# Patient Record
Sex: Female | Born: 1955 | Race: Black or African American | Hispanic: No | Marital: Single | Smoking: Never smoker
Health system: Southern US, Community
[De-identification: ages and names within clinical notes are randomized; demographics above are authoritative.]

## PROBLEM LIST (undated history)

## (undated) DIAGNOSIS — E7800 Pure hypercholesterolemia, unspecified: Secondary | ICD-10-CM

## (undated) DIAGNOSIS — L723 Sebaceous cyst: Secondary | ICD-10-CM

## (undated) DIAGNOSIS — E119 Type 2 diabetes mellitus without complications: Secondary | ICD-10-CM

## (undated) DIAGNOSIS — N898 Other specified noninflammatory disorders of vagina: Secondary | ICD-10-CM

## (undated) DIAGNOSIS — I10 Essential (primary) hypertension: Secondary | ICD-10-CM

## (undated) HISTORY — DX: Other specified noninflammatory disorders of vagina: N89.8

## (undated) HISTORY — DX: Sebaceous cyst: L72.3

## (undated) HISTORY — DX: Type 2 diabetes mellitus without complications: E11.9

## (undated) HISTORY — DX: Pure hypercholesterolemia, unspecified: E78.00

## (undated) HISTORY — PX: ABDOMINAL HYSTERECTOMY: SHX81

## (undated) HISTORY — PX: OTHER SURGICAL HISTORY: SHX169

---

## 2001-01-18 ENCOUNTER — Ambulatory Visit (HOSPITAL_COMMUNITY): Admission: RE | Admit: 2001-01-18 | Discharge: 2001-01-18 | Payer: Self-pay | Admitting: Internal Medicine

## 2001-01-18 ENCOUNTER — Encounter: Payer: Self-pay | Admitting: Internal Medicine

## 2001-01-26 ENCOUNTER — Inpatient Hospital Stay (HOSPITAL_COMMUNITY): Admission: AD | Admit: 2001-01-26 | Discharge: 2001-01-28 | Payer: Self-pay | Admitting: Internal Medicine

## 2003-02-09 ENCOUNTER — Inpatient Hospital Stay (HOSPITAL_COMMUNITY): Admission: RE | Admit: 2003-02-09 | Discharge: 2003-02-11 | Payer: Self-pay | Admitting: Obstetrics & Gynecology

## 2005-11-03 ENCOUNTER — Ambulatory Visit (HOSPITAL_COMMUNITY): Admission: RE | Admit: 2005-11-03 | Discharge: 2005-11-03 | Payer: Self-pay | Admitting: Internal Medicine

## 2006-06-23 ENCOUNTER — Ambulatory Visit (HOSPITAL_COMMUNITY): Admission: RE | Admit: 2006-06-23 | Discharge: 2006-06-23 | Payer: Self-pay | Admitting: Urology

## 2009-09-11 ENCOUNTER — Encounter: Payer: Self-pay | Admitting: Internal Medicine

## 2009-09-26 ENCOUNTER — Ambulatory Visit (HOSPITAL_COMMUNITY): Admission: RE | Admit: 2009-09-26 | Discharge: 2009-09-26 | Payer: Self-pay | Admitting: Internal Medicine

## 2009-09-26 ENCOUNTER — Ambulatory Visit: Payer: Self-pay | Admitting: Internal Medicine

## 2010-01-25 ENCOUNTER — Encounter: Payer: Self-pay | Admitting: Internal Medicine

## 2010-04-23 NOTE — Letter (Signed)
Summary: RELEASE OF RECORDS TO PT  RELEASE OF RECORDS TO PT   Imported By: Diana Eves 01/25/2010 13:07:01  _____________________________________________________________________  External Attachment:    Type:   Image     Comment:   External Document

## 2010-04-23 NOTE — Letter (Signed)
Summary: Internal Other Domingo Dimes  Internal Other Domingo Dimes   Imported By: Cloria Spring LPN 16/12/9602 54:09:81  _____________________________________________________________________  External Attachment:    Type:   Image     Comment:   External Document

## 2010-08-09 NOTE — Op Note (Signed)
[facility]  Patient:    Karina Kirk, Karina Kirk Visit Number: [id number] MRN: [id number]          Service Type: MED Location: 3A A311 01 Attending Physician:  Arna Snipe Dictated by:   Arna Snipe, M.D. Proc. Date: 01/26/01 Admit Date:  01/26/2001                             Operative Report  PREOPERATIVE DIAGNOSIS:  Fibroid uterus.  POSTOPERATIVE DIAGNOSIS:  Fibroid uterus.  OPERATION PERFORMED:  Total abdominal hysterectomy, left salpingo-oophorectomy and incidental appendectomy.  COMPLICATIONS:  None.  SURGEON:  Arna Snipe, M.D.  ANESTHESIA:  General anesthesia.  DESCRIPTION OF PROCEDURE:  Under adequate general anesthesia, patient was prepped and draped in the usual manner and a transverse Pfannenstiel incision was made.  This was carried through subcutaneous tissue to the surface of the anterior rectus sheath, which was incised in the midline, and the rectus muscle retracted.  The posterior rectus sheath and peritoneum were incised, the abdomen opened and explored.  The uterus was enlarged probably to about 4 months size.  There were multiple fibroids, especially a large one on the anterior aspect.  The left ovary was cystic; the right ovary appeared to be normal.  Small and large bowel were normal.  There was a normal-appearing retrocecal appendix.  The gallbladder felt normal.  The round ligaments were clamped, divided and suture-ligated with #1 chromic. The infundibulopelvic ligament on the left was clamped, divided and suture-ligated with #1 chromic.  The right mesosalpinx was clamped, divided and suture-ligated with #1 chromic.  The bladder was pushed inferiorly out of harms way and the uterine vessels were clamped, divided and suture-ligated with #1 chromic.  Following this, with upward traction on the uterus, utilizing electrocautery, the uterus, body and cervix, was removed in toto. The vaginal cuff was closed with a  continuous-locking #1 chromic, the entire operative area then reperitonealized with a continuous 0 chromic.  At completion of this portion of the procedure, all bleeding was under control. The cecum was identified and introduced into a wound.  There was noted to be a retrocecal appendix which was mobilized, mesoappendix doubly clamped, divided and ligated with 0 chromic catgut, the appendix ligated doubly at its base, amputated and buried in the mesoappendix.  After determining all bleeding was under control, the wound was closed in layers, the peritoneum and posterior rectus sheath with 00 Novofil, the rectus muscle and anterior rectus sheath with 00 Novofil, the subcutaneous tissue was closed with continuous 3-0 Vicryl and skin was closed with skin clips, Telfa and OpSite dressing applied.  The patient tolerated the procedure nicely and left the room in good condition. Dictated by:   Arna Snipe, M.D. Attending Physician:  Arna Snipe DD:  01/26/01 TD:  01/27/01 Job: [id number]

## 2010-08-09 NOTE — H&P (Signed)
Karina Kirk, Karina Kirk                           ACCOUNT NO.:  [id number]   MEDICAL RECORD NO.:  [id number]                   PATIENT TYPE:  AMB   LOCATION:  DAY                                  FACILITY:  [facility]   PHYSICIAN:  Lazaro Arms, M.D.                DATE OF BIRTH:  08/24/1955   DATE OF ADMISSION:  DATE OF DISCHARGE:                                HISTORY & PHYSICAL   HISTORY OF PRESENT ILLNESS:  The patient is a 55 year old African American  female, gravida 2, para 2, status post hysterectomy, who presented to our  office originally, January 11, 2003, complaining of stress urinary  incontinence with coughing and feeling like her bladder was falling out.  On  examination in the office initially, she had a grade II cystocele and  urethrocele and a positive Q-tip test.  She underwent urinary testing which  revealed genuine stress urinary incontinence.  There was no extrinsic  sphincter deficiency and no detrusor instability.  As a result, and because  of symptomatic cystocele and stress incontinence, she is admitted for an  anterior colporrhaphy, using a pelvichol graft, a retropubic midurethral  sling, using pelvilace and a vaginal vault suspension.   PAST MEDICAL HISTORY:  Significant for hypertension.   MEDICATIONS:  She did not bring it with her, but it is an antihypertensive.  Otherwise, she is not on any medications.   PAST SURGICAL HISTORY:  Significant only for the hysterectomy.   PAST OB HISTORY:  Two vaginal deliveries.   REVIEW OF SYSTEMS:  Otherwise, negative.   PHYSICAL EXAMINATION:  VITAL SIGNS:  Blood pressure in the office is 140/90.  GENERAL:  Weight 155 pounds.  HEENT:  Unremarkable.  NECK:  Thyroid is normal.  LUNGS:  Clear.  HEART:  Regular rate and rhythm.  No murmur, rubs or gallops.  BREASTS:  Without mass, discharge, skin changes.  ABDOMEN:  Benign, no hepatosplenomegaly or masses.  GENITOURINARY:  Normal external genitalia, except for the grade  II-III  cystocele and urethrocele.  There is no rectocele and the vaginal apex has  some relaxation with straining.  RECTAL:  Normal.  EXTREMITIES:  Warm, edema.  NEUROLOGIC:  Grossly intact.   IMPRESSIONS:  1. Symptomatic grade II-III cystocele.  2. Stress urinary incontinence by urodynamic testing.   PLAN:  The patient is admitted for an anterior colporrhaphy with pelvichol  graft, vaginal vault suspension as well and a retropubic midurethral sling  for stress incontinence.  She has been given handouts regarding the  pelvichol material and pelvilace.  She also understands the risks of  bleeding, infection, damage to other organs, most specifically, the bladder  and bowels and will proceed.     ___________________________________________  Lazaro Arms, M.D.   Loraine Maple  D:  02/08/2003  T:  02/09/2003  Job:  [id number]

## 2010-08-09 NOTE — Op Note (Signed)
NAMERAVENNE, WAYMENT                           ACCOUNT NO.:  [id number]   MEDICAL RECORD NO.:  [id number]                   PATIENT TYPE:  INP   LOCATION:  A417                                 FACILITY:  [facility]   PHYSICIAN:  Lazaro Arms, M.D.                DATE OF BIRTH:  1955/07/18   DATE OF PROCEDURE:  02/09/2003  DATE OF DISCHARGE:  02/11/2003                                 OPERATIVE REPORT   PREOPERATIVE DIAGNOSES:  1. Symptomatic cystocele, grade 3.  2. Urethrocele.  3. Stress urinary incontinence by history and definitely diagnosed with     interoffice urodynamics.   POSTOPERATIVE DIAGNOSES:  1. Symptomatic cystocele, grade 3.  2. Urethrocele.  3. Stress urinary incontinence by history and definitely diagnosed with     interoffice urodynamics.   OPERATION/PROCEDURE:  1. Anterior colporrhaphy with use of Pelvicol graft.  2. Retropubic mid urethral sling.  3. Interspinous vaginal vault suspension.   SURGEON:  Lazaro Arms, M.D.   ANESTHESIA:  General endotracheal anesthesia.   FINDINGS:  The patient had a grade 3 cystocele and vaginal apex prolapse.  She also had symptomatic genuine stress urinary incontinence.   DESCRIPTION OF PROCEDURE:  The patient was taken to the operating room and  placed in the supine position, underwent general anesthesia and she was then  prepped and draped in the usual sterile fashion after having been placed in  the dorsal lithotomy position.  A Foley catheter was placed.  The vaginal  apex was grasped and incised.  The anterior vagina was then dissected off of  the bladder after 0.5% Marcaine was injected for hemostasis and plane  development, dissected laterally all the way to a sacrospinous loop to the  ischial spines and the sacrospinous ligaments were identified.  An 8 x 12  Pelvicol graft was then used and attached to the sacrospinous ligaments  bilaterally using a ________ needle retriever system and 0 Vicryl hub  suture.  This  was done without difficulty.  I then made two small incisions  just above the pubis, 2 cm from the midline bilaterally and the mid urethral  Pelvicol sling was then placed without difficulty in the usual fashion.  A  cystoscopy was performed when both needles were in place and there was no  bladder perforation that was found. It was then pulled back through and  placed loosely under the mid urethra.  I then attached the anterior or  cephalad portion of the Pelvicol graft to the pubovesical fascia bilaterally  and then attached the anterior vagina to the wing of the Pelvicol graft as  an interspinous vaginal vault  suspension.  The excess vagina was then trimmed and then closed in the usual  fashion without difficulty.  There was good hemostasis.  Vaginal packing was  used.  The patient was awakened from anesthesia and taken to the recovery  room in good  and stable condition.  All counts were correct.      ___________________________________________                                            Lazaro Arms, M.D.   LHE/MEDQ  D:  02/23/2003  T:  02/23/2003  Job:  [id number]

## 2010-08-09 NOTE — Discharge Summary (Signed)
[facility]  Patient:    Karina Kirk, Karina Kirk Visit Number: [id number] MRN: [id number]          Service Type: MED Location: 3A A311 01 Attending Physician:  Arna Snipe Dictated by:   Arna Snipe, M.D. Admit Date:  01/26/2001 Discharge Date: 01/28/2001                             Discharge Summary  ADMISSION DIAGNOSIS:  Fibroid uterus with menometrorrhagia.  DISCHARGE DIAGNOSIS:  Fibroid uterus with menometrorrhagia.  OPERATIONS:  January 26, 2001, total abdominal hysterectomy, left salpingo-oophorectomy, and incidental appendectomy.  COMPLICATIONS:  None.  PROGNOSIS:  Good.  The patient recovered.  HISTORY OF PRESENT ILLNESS:  This 55 year old black female was admitted to the hospital on January 26, 2001, with a long history of irregular, prolonged vaginal bleeding associated with her periods.  Exam did reveal a large fibroid uterus.  She was admitted to the hospital for that reason.  HOSPITAL COURSE:  On January 26, 2001, the patient was taken to the operating room.  A total abdominal hysterectomy with salpingo-oophorectomy for a large fibroid uterus and incidental removal of a retrocecal appendix was carried out without difficulty.  Postoperatively she did well.  Foley catheter was removed on January 27, 2001, and she was voiding without difficulty.  Her urine did reveal multiple wbcs, and the patient was started on Septra.  From this point on, her progress was good.  She was discharged home on January 28, 2001, on Septra.  The wound was redressed and was clean.  She will be seen in my office in one week. Dictated by:   Arna Snipe, M.D. Attending Physician:  Arna Snipe DD:  04/09/01 TD:  04/11/01 Job: [id number]

## 2010-08-09 NOTE — H&P (Signed)
[facility]  Patient:    Karina Kirk, Karina Kirk Visit Number: [id number] MRN: [id number]          Service Type: MED Location: 3A A311 01 Attending Physician:  Arna Snipe Dictated by:   Arna Snipe, M.D. Admit Date:  01/26/2001                           History and Physical  This 55 year old black female is admitted to this hospital for a total abdominal hysterectomy.  HISTORY OF PRESENT ILLNESS:  This patient gives a long history of irregular periods and chronic vaginal infection.  I have been treating her on and off since 1996 for these problems, all to no avail.  Recent ultrasound did reveal multiple large fibroids.  She does give a history in the last 6-8 months that periods are lasting for about 2 weeks.  She is admitted now for a hysterectomy as mentioned.  PAST HISTORY:  She is a gravida 2, para 2, AB 0.  She has had a prior bilateral tubal ligation.  MEDICATIONS:  She presently takes Zestoretic 20/25 daily.  PHYSICAL EXAMINATION:  GENERAL:  Reveals a healthy, 55 year old black female in no acute distress.  VITAL SIGNS:  Her blood pressure was 144/86, pulse 80, respirations 20.  HEAD, EYES, EARS, NOSE AND THROAT:  Normal.  No jaundice.  NECK:  Supple.  Thyroid not enlarged.  No palpable cervical adenopathy.  CARDIOVASCULAR:  Regular sinus rhythm, no thrills or murmurs.  RESPIRATORY:  Chest clear to percussion and auscultation.  BREASTS:  Moderate size.  Nipples symmetrical.  No abnormal masses. Examination of both axillae are normal.  ABDOMEN:  Soft, small incision below the umbilicus secondary to previous tubal ligation.  No areas of muscle guarding, or visceromegaly.  Normal peristalsis.  LIMBS AND BACK:  Negative.  PELVIC:  There is a marital introitus.  No pathology Bartholin and skene glands.  There is some old blood in the vault.  A recent Pap smear was normal. Cervix is clean.  Uterus is anterior.  It appears to be  enlarged to about 3-4 months size.  Examination of both adnexa are normal.  RECTAL/VAGINAL:  Examination is normal.  ADMISSION DIAGNOSIS:  Fibroid uterus with menometrorrhagia.  DISPOSITION:  The patient is admitted for a total abdominal hysterectomy, possibly an appendectomy.  The surgery, risks, complications, and consequences have been discussed thoroughly with this patient.  She agrees to the surgery. She has been scheduled for November 5. Dictated by:   Arna Snipe, M.D. Attending Physician:  Arna Snipe DD:  01/25/01 TD:  01/25/01 Job: [id number]

## 2010-10-26 ENCOUNTER — Encounter: Payer: Self-pay | Admitting: *Deleted

## 2010-10-26 ENCOUNTER — Emergency Department (HOSPITAL_COMMUNITY): Payer: Worker's Compensation

## 2010-10-26 ENCOUNTER — Emergency Department (HOSPITAL_COMMUNITY)
Admission: EM | Admit: 2010-10-26 | Discharge: 2010-10-26 | Disposition: A | Discharge status: Home / Self Care | Payer: Worker's Compensation | Attending: Emergency Medicine | Admitting: Emergency Medicine

## 2010-10-26 DIAGNOSIS — I10 Essential (primary) hypertension: Secondary | ICD-10-CM | POA: Insufficient documentation

## 2010-10-26 DIAGNOSIS — S0990XA Unspecified injury of head, initial encounter: Secondary | ICD-10-CM

## 2010-10-26 DIAGNOSIS — W010XXA Fall on same level from slipping, tripping and stumbling without subsequent striking against object, initial encounter: Secondary | ICD-10-CM | POA: Insufficient documentation

## 2010-10-26 DIAGNOSIS — Y9269 Other specified industrial and construction area as the place of occurrence of the external cause: Secondary | ICD-10-CM | POA: Insufficient documentation

## 2010-10-26 HISTORY — DX: Essential (primary) hypertension: I10

## 2010-10-26 MED ORDER — HYDROCODONE-ACETAMINOPHEN 5-325 MG PO TABS: ORAL_TABLET | ORAL | Status: DC

## 2010-10-26 NOTE — ED Provider Notes (Signed)
History     CSN: [id number] Arrival date & time: 10/26/2010  4:14 PM  Chief Complaint  Patient presents with  . Head Injury   HPI Comments: Patient states she was at work and tripped over something and fell, struck her left head on the concrete floor.  She c/o headache since the fall but denies LOC, neck or back pain, visual changes, vomiting , weakness or dizziness.  Patient is a 55 y.o. female presenting with head injury. The history is provided by the patient.  Head Injury  The incident occurred 1 to 2 hours ago. She came to the ER via walk-in. The injury mechanism was a fall. There was no loss of consciousness. There was no blood loss. The quality of the pain is described as dull and throbbing. The pain is mild. The pain has been constant since the injury. Pertinent negatives include no numbness, no blurred vision, no vomiting, no disorientation, no weakness and no memory loss. Associated symptoms comments: headache. She has tried nothing for the symptoms. The treatment provided no relief.    Past Medical History  Diagnosis Date  . Hypertension     Past Surgical History  Procedure Date  . Abdominal hysterectomy     History reviewed. No pertinent family history.  History  Substance Use Topics  . Smoking status: Never Smoker   . Smokeless tobacco: Not on file  . Alcohol Use: No    OB History    Grav Para Term Preterm Abortions TAB SAB Ect Mult Living                  Review of Systems  Constitutional: Negative for fever, chills, activity change and appetite change.  HENT: Negative for nosebleeds, facial swelling, trouble swallowing, neck pain and neck stiffness.   Eyes: Negative for blurred vision, photophobia, pain, redness and visual disturbance.  Respiratory: Negative.   Cardiovascular: Negative.   Gastrointestinal: Negative for nausea and vomiting.  Musculoskeletal: Negative.   Skin: Negative.   Neurological: Positive for headaches. Negative for dizziness, speech  difficulty, weakness, light-headedness and numbness.  Hematological: Does not bruise/bleed easily.  Psychiatric/Behavioral: Negative.  Negative for memory loss.    Physical Exam  BP 144/82  Pulse 92  Temp(Src) 98.7 F (37.1 C) (Oral)  Resp 16  Ht 5\' 6"  (1.676 m)  Wt 160 lb (72.576 kg)  BMI 25.82 kg/m2  SpO2 100%  Physical Exam  Nursing note and vitals reviewed. Constitutional: She is oriented to person, place, and time. She appears well-developed and well-nourished. No distress.  HENT:  Head: Normocephalic and atraumatic.  Right Ear: External ear normal.  Mouth/Throat: Oropharynx is clear and moist.  Eyes: Conjunctivae and EOM are normal. Pupils are equal, round, and reactive to light.  Neck: Normal range of motion. Neck supple.  Cardiovascular: Normal rate, regular rhythm and normal heart sounds.   Pulmonary/Chest: Effort normal and breath sounds normal.  Abdominal: Soft. Bowel sounds are normal. There is no tenderness.  Musculoskeletal: She exhibits tenderness. She exhibits no edema.  Lymphadenopathy:    She has no cervical adenopathy.  Neurological: She is alert and oriented to person, place, and time. She has normal reflexes. She displays no tremor and normal reflexes. No cranial nerve deficit or sensory deficit. She exhibits normal muscle tone. She displays a negative Romberg sign. Coordination and gait normal. GCS eye subscore is 4. GCS verbal subscore is 5. GCS motor subscore is 6.  Skin: Skin is warm and dry.  Psychiatric: She has a  normal mood and affect.    ED Course  Procedures  MDM   1750  Patient is resting, talking with coworker.  NAD.  Vitals stable.  Ambulates w/o difficulty.  No focal neuro deficits.  Negative Rhomberg.  Likely minor head injury.  I have reviewed CT results and discussed CT the results with the pt and she agrees to close follow-up with her PMD and I have also advised her to return here for any worsening symptoms      Tammy L. [place],  [place] 10/28/10 1545  Medical screening examination/treatment/procedure(s) were performed by non-physician practitioner and as supervising physician I was immediately available for consultation/collaboration.   Sunnie Nielsen, MD 10/31/10 [phone]

## 2010-10-26 NOTE — ED Notes (Signed)
Patient with no complaints at this time. Respirations even and unlabored. Skin warm/dry. Discharge instructions reviewed with patient at this time. Patient given opportunity to voice concerns/ask questions. Patient discharged at this time and left Emergency Department with steady gait.   

## 2010-10-26 NOTE — ED Notes (Signed)
Pt tripped and fell at work. Hit the front left side of her head on concrete. Denies loss of consciousness.

## 2012-09-22 ENCOUNTER — Other Ambulatory Visit: Payer: Self-pay | Admitting: Adult Health

## 2012-11-23 ENCOUNTER — Ambulatory Visit (INDEPENDENT_AMBULATORY_CARE_PROVIDER_SITE_OTHER): Payer: 59 | Admitting: Adult Health

## 2012-11-23 ENCOUNTER — Encounter: Payer: Self-pay | Admitting: Adult Health

## 2012-11-23 VITALS — BP 100/60 | HR 72 | Ht 64.0 in | Wt 165.0 lb

## 2012-11-23 DIAGNOSIS — E119 Type 2 diabetes mellitus without complications: Secondary | ICD-10-CM | POA: Insufficient documentation

## 2012-11-23 DIAGNOSIS — E7800 Pure hypercholesterolemia, unspecified: Secondary | ICD-10-CM

## 2012-11-23 DIAGNOSIS — Z01419 Encounter for gynecological examination (general) (routine) without abnormal findings: Secondary | ICD-10-CM

## 2012-11-23 DIAGNOSIS — I10 Essential (primary) hypertension: Secondary | ICD-10-CM | POA: Insufficient documentation

## 2012-11-23 DIAGNOSIS — Z1212 Encounter for screening for malignant neoplasm of rectum: Secondary | ICD-10-CM

## 2012-11-23 LAB — HEMOCCULT GUIAC POC 1CARD (OFFICE): Fecal Occult Blood, POC: NEGATIVE

## 2012-11-23 NOTE — Progress Notes (Signed)
Patient ID: Karina Kirk, female   DOB: Apr 03, 1955, 57 y.o.   MRN: [id number] History of Present Illness: Karina Kirk is a 57 year old black female in for physical.   Current Medications, Allergies, Past Medical History, Past Surgical History, Family History and Social History were reviewed in [employer] medical record.     Review of Systems: Patient denies any headaches, blurred vision, shortness of breath, chest pain, abdominal pain, problems with bowel movements, urination, or intercourse.Not having sex. Neck feels still,? Slept wrong, no mood changes.    Physical Exam:BP 100/60  Pulse 72  Ht 5\' 4"  (1.626 m)  Wt 165 lb (74.844 kg)  BMI 28.31 kg/m2 General:  Well developed, well nourished, no acute distress Skin:  Warm and dry Neck:  Midline trachea, normal thyroid Lungs; Clear to auscultation bilaterally Breast:  No dominant palpable mass, retraction, or nipple discharge Cardiovascular: Regular rate and rhythm Abdomen:  Soft, non tender, no hepatosplenomegaly Pelvic:  External genitalia is normal in appearance.  The vagina is normal in appearance. The cervix and uterus are absent.Cuff looks good.  No adnexal masses or tenderness noted. Rectal: Good sphincter tone, no polyps, or hemorrhoids felt.  Hemoccult negative. Extremities:  No swelling or varicosities noted Psych:  Alert andcooperative, seems happy   Impression: Yearly gyn exam History of hypertension,diabetes,a dn elevated cholesterol   Plan: Physical in 1 year Mammogram yearly  Colonoscopy at 56  Labs with Dr Margo Aye Call prn

## 2012-11-23 NOTE — Patient Instructions (Addendum)
Physical in 1 year Mammogram yearly Colonoscopy at 61  Labs at PCP

## 2014-01-23 ENCOUNTER — Encounter: Payer: Self-pay | Admitting: Adult Health

## 2015-03-16 ENCOUNTER — Encounter: Payer: Self-pay | Admitting: Obstetrics & Gynecology

## 2015-03-16 ENCOUNTER — Ambulatory Visit (INDEPENDENT_AMBULATORY_CARE_PROVIDER_SITE_OTHER): Payer: BLUE CROSS/BLUE SHIELD | Admitting: Obstetrics & Gynecology

## 2015-03-16 VITALS — BP 100/62 | Ht 63.0 in | Wt 147.0 lb

## 2015-03-16 DIAGNOSIS — B9689 Other specified bacterial agents as the cause of diseases classified elsewhere: Secondary | ICD-10-CM

## 2015-03-16 DIAGNOSIS — N760 Acute vaginitis: Secondary | ICD-10-CM | POA: Diagnosis not present

## 2015-03-16 DIAGNOSIS — A499 Bacterial infection, unspecified: Secondary | ICD-10-CM | POA: Diagnosis not present

## 2015-03-16 DIAGNOSIS — L293 Anogenital pruritus, unspecified: Secondary | ICD-10-CM | POA: Diagnosis not present

## 2015-03-16 MED ORDER — METRONIDAZOLE 500 MG PO TABS: 500.0000 mg | ORAL_TABLET | Freq: Two times a day (BID) | ORAL | Status: DC

## 2015-03-16 NOTE — Progress Notes (Signed)
Patient ID: Karina Kirk, female   DOB: November 12, 1955, 59 y.o.   MRN: [id number] History of Present Illness   Patient Identification Karina Kirk is a 59 y.o. female.  Patient information was obtained from patient. History/Exam limitations: none. Patient presented to the [facility] by private vehicle.  Chief Complaint  Vaginal Itching   The patient complains vaginal discharge scant. Onset of symptoms was gradual starting 2 weeks ago. Severity of symptoms mild. Symptoms occur spontaneous Symptoms have been constant. Symptoms are aggravated by nothing, alleviated by nothing and are associated with . Other modifying factors include diabetic no sex for 5-6 years. Previous evaluation includes none. Care prior to arrival consisted of nothing, with no relief.  Past Medical History  Diagnosis Date  . Hypertension   . Diabetes mellitus without complication ([place])   . Elevated cholesterol    Family History  Problem Relation Age of Onset  . Diabetes Mother   . Hypertension Mother   . Diabetes Sister   . Hypertension Sister    Scheduled Meds: Continuous Infusions: PRN Meds:  Allergies  Allergen Reactions  . Sulfa Antibiotics Rash   Social History   Social History  . Marital Status: Single    Spouse Name: N/A  . Number of Children: N/A  . Years of Education: N/A   Occupational History  . Not on file.   Social History Main Topics  . Smoking status: Never Smoker   . Smokeless tobacco: Never Used  . Alcohol Use: No     Comment: occ. beer  . Drug Use: No  . Sexual Activity: Not Currently    Birth Control/ Protection: Surgical   Other Topics Concern  . Not on file   Social History Narrative   Review of Systems Pertinent items are noted in HPI.   Physical Exam   BP 100/62 mmHg  Ht 5\' 3"  (1.6 m)  Wt 147 lb (66.679 kg)  BMI 26.05 kg/m2 General:   alert, cooperative and no distress  Heart:   Lungs:   Abdomen: soft, non-tender, without masses or organomegaly  Pelvic:     Vulva: normal   Vagina:  thin grey discharge   Cervix: absent   Uterus: absent   Adnexa:    ED Course   Studies: Lab: wet prep  Wet Prep:   A sample of vaginal discharge was obtained from the posterior fornix using a cotton swab. 2 drops of saline were placed on a slide and the cotton swab was immersed in the saline. Microscopic evaluation was performed and results were as follows:  Negative  for yeast  Positive for clue cells , consistent with Bacterial vaginosis Negative for trichomonas  Normal WBC population   Whiff test: Positive   Records Reviewed: Old medical records.  Treatments:   Consultations:   Disposition:   BV (bacterial vaginosis)  Meds ordered this encounter  Medications  . metroNIDAZOLE (FLAGYL) 500 MG tablet    Sig: Take 1 tablet (500 mg total) by mouth 2 (two) times daily.    Dispense:  14 tablet    Refill:  0    Follow up 3 weeks .

## 2015-04-05 ENCOUNTER — Ambulatory Visit: Payer: BLUE CROSS/BLUE SHIELD | Admitting: Obstetrics & Gynecology

## 2015-05-03 ENCOUNTER — Ambulatory Visit: Payer: BLUE CROSS/BLUE SHIELD | Admitting: Obstetrics & Gynecology

## 2015-06-22 ENCOUNTER — Encounter: Payer: Self-pay | Admitting: Adult Health

## 2015-06-22 ENCOUNTER — Ambulatory Visit (INDEPENDENT_AMBULATORY_CARE_PROVIDER_SITE_OTHER): Payer: BLUE CROSS/BLUE SHIELD | Admitting: Adult Health

## 2015-06-22 VITALS — BP 132/80 | HR 76 | Ht 65.0 in | Wt 144.5 lb

## 2015-06-22 DIAGNOSIS — N898 Other specified noninflammatory disorders of vagina: Secondary | ICD-10-CM

## 2015-06-22 DIAGNOSIS — Z90710 Acquired absence of both cervix and uterus: Secondary | ICD-10-CM | POA: Diagnosis not present

## 2015-06-22 HISTORY — DX: Other specified noninflammatory disorders of vagina: N89.8

## 2015-06-22 LAB — POCT WET PREP (WET MOUNT)
Clue Cells Wet Prep Whiff POC: NEGATIVE
WBC, Wet Prep HPF POC: POSITIVE

## 2015-06-22 NOTE — Progress Notes (Signed)
Subjective:     Patient ID: Karina Kirk, female   DOB: [birth year], 60 y.o.   MRN: [id number]  HPI Karina Kirk is a 60 year old black female in complaining of discharge in her panties, no itching or burning or odor. PCP is Dr Karina Kirk.   Review of Systems Patient denies any headaches, hearing loss, fatigue, blurred vision, shortness of breath, chest pain, abdominal pain, problems with bowel movements, urination, or intercourse(not having sex). No joint pain or mood swings, + vaginal discharge Reviewed past medical,surgical, social and family history. Reviewed medications and allergies.     Objective:   Physical Exam BP 132/80 mmHg  Pulse 76  Ht 5\' 5"  (1.651 m)  Wt 144 lb 8 oz (65.545 kg)  BMI 24.05 kg/m2 Skin warm and dry.Pelvic: external genitalia is normal in appearance no lesions, vagina: white discharge without odor,urethra has no lesions or masses noted, cervix and uterus are absent, adnexa: no masses or tenderness noted. Bladder is non tender and no masses felt. Wet prep:  +WBCs.   Discussed that some discharge is normal, as long as no odor or burning or itching.  Assessment:     Vaginal discharge    Plan:     Follow up prn

## 2015-06-22 NOTE — Patient Instructions (Signed)
Follow up prn

## 2015-06-22 NOTE — Addendum Note (Signed)
Addended by: Derrek Monaco A on: 06/22/2015 10:46 AM   Modules accepted: Orders

## 2016-01-03 ENCOUNTER — Ambulatory Visit (INDEPENDENT_AMBULATORY_CARE_PROVIDER_SITE_OTHER): Payer: BLUE CROSS/BLUE SHIELD | Admitting: Orthopaedic Surgery

## 2016-01-03 DIAGNOSIS — S61210S Laceration without foreign body of right index finger without damage to nail, sequela: Secondary | ICD-10-CM | POA: Diagnosis not present

## 2016-01-17 ENCOUNTER — Ambulatory Visit (INDEPENDENT_AMBULATORY_CARE_PROVIDER_SITE_OTHER): Payer: BLUE CROSS/BLUE SHIELD | Admitting: Women's Health

## 2016-01-17 ENCOUNTER — Encounter: Payer: Self-pay | Admitting: Women's Health

## 2016-01-17 DIAGNOSIS — L292 Pruritus vulvae: Secondary | ICD-10-CM

## 2016-01-17 DIAGNOSIS — N9089 Other specified noninflammatory disorders of vulva and perineum: Secondary | ICD-10-CM | POA: Diagnosis not present

## 2016-01-17 MED ORDER — NYSTATIN-TRIAMCINOLONE 100000-0.1 UNIT/GM-% EX OINT: 1.0000 "application " | TOPICAL_OINTMENT | Freq: Two times a day (BID) | CUTANEOUS | 0 refills | Status: DC
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
## 2016-01-18 NOTE — Addendum Note (Signed)
Addended by: Diona Fanti A on: 01/18/2016 01:58 PM   Modules accepted: Orders

## 2016-01-21 LAB — HERPES SIMPLEX VIRUS CULTURE

## 2016-01-24 ENCOUNTER — Encounter: Payer: Self-pay | Admitting: Women's Health

## 2016-01-24 ENCOUNTER — Ambulatory Visit (INDEPENDENT_AMBULATORY_CARE_PROVIDER_SITE_OTHER): Payer: BLUE CROSS/BLUE SHIELD | Admitting: Women's Health

## 2016-01-24 VITALS — BP 122/70 | HR 78

## 2016-01-24 DIAGNOSIS — N9089 Other specified noninflammatory disorders of vulva and perineum: Secondary | ICD-10-CM | POA: Diagnosis not present

## 2016-01-24 DIAGNOSIS — L700 Acne vulgaris: Secondary | ICD-10-CM | POA: Diagnosis not present

## 2016-01-24 DIAGNOSIS — Z90710 Acquired absence of both cervix and uterus: Secondary | ICD-10-CM

## 2016-01-24 NOTE — Progress Notes (Signed)
   [facility] Visit  Patient name: Karina Kirk MRN [id number]  Date of birth: [birth year]  CC & HPI:  Karina Kirk is a 60 y.o. G36P2 African American female presenting today for f/u vulvar lesions. HSV culture was neg although lesions were ~1wk old and dried. Pt reports itching has resolved, feels much better. Has 'blackhead' on Lt upper back x 10mth, daughter tried to squeeze it when it was smaller but couldn't get anything out. Not hurting, no drainage.  No LMP recorded. Patient has had a hysterectomy.  Pertinent History Reviewed:  Medical & Surgical Hx:   Past medical, surgical, family, and social history reviewed in electronic medical record Medications: Reviewed & Updated - see associated section Allergies: Reviewed in electronic medical record  Objective Findings:  Vitals: BP 122/70 (BP Location: Right Arm, Patient Position: Sitting, Cuff Size: Normal)   Pulse 78  There is no height or weight on file to calculate BMI.  Physical Examination: General appearance - alert, well appearing, and in no distress Pelvic: vulvar lesions completely resolved Back: ~1.5cm raised comedone Lt upper shoulder, co-exam w/ JVF- will need to come back to have excised  No results found for this or any previous visit (from the past 24 hour(s)).   Assessment & Plan:  A:   Resolved vulvar lesions  P:  Return if vulvar lesions return  Return for 4:00pm appt w/ JVF for removal of comedone from back. (pt prefers 4pm appt d/t work)  Tawnya Crook CNM, [facility] 01/24/2016 4:52 PM

## 2016-02-04 ENCOUNTER — Telehealth (INDEPENDENT_AMBULATORY_CARE_PROVIDER_SITE_OTHER): Payer: Self-pay | Admitting: *Deleted

## 2016-02-04 NOTE — Telephone Encounter (Signed)
Pt called stating Dr. Lorin Mercy was going to refe pt to physical therapy but pt has not heard from anyone. pt call back number is [phone]
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
## 2016-02-04 NOTE — Telephone Encounter (Signed)
She has already had therapy. I called, she has been released. Has had extension therapy.FYI

## 2016-02-20 ENCOUNTER — Ambulatory Visit: Payer: BLUE CROSS/BLUE SHIELD | Admitting: Obstetrics and Gynecology

## 2016-02-29 ENCOUNTER — Emergency Department (HOSPITAL_COMMUNITY)
Admission: EM | Admit: 2016-02-29 | Discharge: 2016-02-29 | Disposition: A | Discharge status: Home / Self Care | Payer: BLUE CROSS/BLUE SHIELD | Attending: Emergency Medicine | Admitting: Emergency Medicine

## 2016-02-29 ENCOUNTER — Emergency Department (HOSPITAL_COMMUNITY): Payer: BLUE CROSS/BLUE SHIELD

## 2016-02-29 ENCOUNTER — Encounter (HOSPITAL_COMMUNITY): Payer: Self-pay | Admitting: Emergency Medicine

## 2016-02-29 DIAGNOSIS — I10 Essential (primary) hypertension: Secondary | ICD-10-CM | POA: Insufficient documentation

## 2016-02-29 DIAGNOSIS — Y939 Activity, unspecified: Secondary | ICD-10-CM | POA: Insufficient documentation

## 2016-02-29 DIAGNOSIS — E119 Type 2 diabetes mellitus without complications: Secondary | ICD-10-CM | POA: Diagnosis not present

## 2016-02-29 DIAGNOSIS — M7918 Myalgia, other site: Secondary | ICD-10-CM

## 2016-02-29 DIAGNOSIS — Z79899 Other long term (current) drug therapy: Secondary | ICD-10-CM | POA: Diagnosis not present

## 2016-02-29 DIAGNOSIS — Y92410 Unspecified street and highway as the place of occurrence of the external cause: Secondary | ICD-10-CM | POA: Insufficient documentation

## 2016-02-29 DIAGNOSIS — M7551 Bursitis of right shoulder: Secondary | ICD-10-CM | POA: Diagnosis not present

## 2016-02-29 DIAGNOSIS — Z7984 Long term (current) use of oral hypoglycemic drugs: Secondary | ICD-10-CM | POA: Insufficient documentation

## 2016-02-29 DIAGNOSIS — S4991XA Unspecified injury of right shoulder and upper arm, initial encounter: Secondary | ICD-10-CM | POA: Diagnosis present

## 2016-02-29 DIAGNOSIS — Y999 Unspecified external cause status: Secondary | ICD-10-CM | POA: Insufficient documentation

## 2016-02-29 MED ORDER — OXYCODONE-ACETAMINOPHEN 5-325 MG PO TABS
1.0000 | ORAL_TABLET | Freq: Once | ORAL | Status: AC
  Administered 2016-02-29: 1 via ORAL
  Filled 2016-02-29: qty 1

## 2016-02-29 MED ORDER — IBUPROFEN 400 MG PO TABS
600.0000 mg | ORAL_TABLET | Freq: Once | ORAL | Status: AC
  Administered 2016-02-29: 600 mg via ORAL
  Filled 2016-02-29: qty 2

## 2016-02-29 MED ORDER — HYDROCODONE-ACETAMINOPHEN 5-325 MG PO TABS: ORAL_TABLET | ORAL | 0 refills | Status: DC

## 2016-02-29 MED ORDER — METHOCARBAMOL 500 MG PO TABS: 1000.0000 mg | ORAL_TABLET | Freq: Four times a day (QID) | ORAL | 0 refills | Status: DC | PRN

## 2016-02-29 NOTE — ED Notes (Signed)
Pt wanting to know how much longer she was going to be here. Informed pt CT results just came back & that I would let EDP know that everything has resulted.

## 2016-02-29 NOTE — ED Notes (Signed)
Pt alert & oriented x4, stable gait. Patient given discharge instructions, paperwork & prescription(s). Patient informed not to drive, operate any equipment & handel any important documents 4 hours after taking pain medication. Patient  instructed to stop at the registration desk to finish any additional paperwork. Patient  verbalized understanding. Pt left department w/ no further questions. 
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
## 2016-02-29 NOTE — Discharge Instructions (Signed)
Take the prescriptions as directed.  Apply moist heat or ice to the area(s) of discomfort, for 15 minutes at a time, several times per day for the next few days.  Do not fall asleep on a heating or ice pack. Wear the shoulder sling for comfort for the next few days, then remove and slowly return to your usual activities. Call your regular medical doctor and the Orthopedist on Monday to schedule a follow up appointment this week.  Return to the Emergency Department immediately if worsening.
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
## 2016-03-03 ENCOUNTER — Telehealth: Payer: Self-pay | Admitting: Orthopedic Surgery
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
## 2016-03-05 ENCOUNTER — Ambulatory Visit (INDEPENDENT_AMBULATORY_CARE_PROVIDER_SITE_OTHER): Payer: BLUE CROSS/BLUE SHIELD | Admitting: Orthopaedic Surgery

## 2016-03-05 ENCOUNTER — Encounter (INDEPENDENT_AMBULATORY_CARE_PROVIDER_SITE_OTHER): Payer: Self-pay | Admitting: Orthopaedic Surgery

## 2016-03-05 VITALS — BP 125/84 | HR 78 | Ht 66.0 in | Wt 158.0 lb

## 2016-03-05 DIAGNOSIS — M25511 Pain in right shoulder: Secondary | ICD-10-CM
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
## 2016-03-11 ENCOUNTER — Ambulatory Visit: Payer: Self-pay | Admitting: Orthopedic Surgery

## 2016-03-13 ENCOUNTER — Telehealth (INDEPENDENT_AMBULATORY_CARE_PROVIDER_SITE_OTHER): Payer: Self-pay | Admitting: Orthopaedic Surgery

## 2016-03-13 NOTE — Telephone Encounter (Signed)
Karina Kirk, this is a [facility] patient.

## 2016-03-14 ENCOUNTER — Ambulatory Visit (INDEPENDENT_AMBULATORY_CARE_PROVIDER_SITE_OTHER): Payer: BLUE CROSS/BLUE SHIELD | Admitting: Orthopaedic Surgery

## 2016-03-14 NOTE — Telephone Encounter (Signed)
I called patient and left voicemail that paperwork is ready at front desk.

## 2016-03-14 NOTE — Telephone Encounter (Signed)
Karina Kirk, sorry I sent this to Marlou Sa instead of you, this is a [facility] pt.

## 2016-03-14 NOTE — Telephone Encounter (Signed)
None or patient

## 2016-03-20 ENCOUNTER — Ambulatory Visit (INDEPENDENT_AMBULATORY_CARE_PROVIDER_SITE_OTHER): Payer: BLUE CROSS/BLUE SHIELD | Admitting: Orthopaedic Surgery

## 2016-03-20 ENCOUNTER — Encounter (INDEPENDENT_AMBULATORY_CARE_PROVIDER_SITE_OTHER): Payer: Self-pay | Admitting: Orthopaedic Surgery

## 2016-03-20 VITALS — BP 134/86 | HR 81 | Ht 66.0 in | Wt 158.0 lb

## 2016-03-20 DIAGNOSIS — M545 Low back pain, unspecified: Secondary | ICD-10-CM

## 2016-03-20 DIAGNOSIS — M542 Cervicalgia: Secondary | ICD-10-CM | POA: Diagnosis not present

## 2016-03-20 DIAGNOSIS — M25511 Pain in right shoulder: Secondary | ICD-10-CM
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
## 2016-03-25 ENCOUNTER — Encounter (HOSPITAL_COMMUNITY): Payer: Self-pay | Admitting: Physical Therapy

## 2016-03-25 ENCOUNTER — Ambulatory Visit (HOSPITAL_COMMUNITY): Payer: BLUE CROSS/BLUE SHIELD | Attending: Orthopaedic Surgery | Admitting: Physical Therapy

## 2016-03-25 ENCOUNTER — Ambulatory Visit (INDEPENDENT_AMBULATORY_CARE_PROVIDER_SITE_OTHER): Payer: BLUE CROSS/BLUE SHIELD | Admitting: Orthopaedic Surgery

## 2016-03-25 DIAGNOSIS — M25511 Pain in right shoulder: Secondary | ICD-10-CM | POA: Insufficient documentation

## 2016-03-25 DIAGNOSIS — M6281 Muscle weakness (generalized): Secondary | ICD-10-CM | POA: Insufficient documentation

## 2016-03-25 DIAGNOSIS — M25551 Pain in right hip: Secondary | ICD-10-CM | POA: Diagnosis not present

## 2016-03-25 DIAGNOSIS — M542 Cervicalgia: Secondary | ICD-10-CM | POA: Insufficient documentation
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
## 2016-04-01 ENCOUNTER — Ambulatory Visit (HOSPITAL_COMMUNITY): Payer: BLUE CROSS/BLUE SHIELD

## 2016-04-01 DIAGNOSIS — M6281 Muscle weakness (generalized): Secondary | ICD-10-CM

## 2016-04-01 DIAGNOSIS — M25511 Pain in right shoulder: Secondary | ICD-10-CM

## 2016-04-01 DIAGNOSIS — M542 Cervicalgia: Secondary | ICD-10-CM

## 2016-04-01 DIAGNOSIS — M25551 Pain in right hip: Secondary | ICD-10-CM
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
## 2016-04-03 ENCOUNTER — Ambulatory Visit (HOSPITAL_COMMUNITY): Payer: BLUE CROSS/BLUE SHIELD | Admitting: Physical Therapy

## 2016-04-03 ENCOUNTER — Telehealth (HOSPITAL_COMMUNITY): Payer: Self-pay | Admitting: Physical Therapy

## 2016-04-03 DIAGNOSIS — M25511 Pain in right shoulder: Secondary | ICD-10-CM

## 2016-04-03 DIAGNOSIS — M542 Cervicalgia: Secondary | ICD-10-CM

## 2016-04-03 DIAGNOSIS — M6281 Muscle weakness (generalized): Secondary | ICD-10-CM

## 2016-04-03 DIAGNOSIS — M25551 Pain in right hip: Secondary | ICD-10-CM
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
## 2016-04-07 ENCOUNTER — Encounter: Payer: Self-pay | Admitting: Obstetrics and Gynecology

## 2016-04-07 ENCOUNTER — Ambulatory Visit (INDEPENDENT_AMBULATORY_CARE_PROVIDER_SITE_OTHER): Payer: BLUE CROSS/BLUE SHIELD | Admitting: Obstetrics and Gynecology

## 2016-04-07 VITALS — BP 106/68 | HR 80 | Wt 151.6 lb

## 2016-04-07 DIAGNOSIS — D235 Other benign neoplasm of skin of trunk: Secondary | ICD-10-CM | POA: Diagnosis not present

## 2016-04-07 DIAGNOSIS — L723 Sebaceous cyst: Secondary | ICD-10-CM

## 2016-04-07 DIAGNOSIS — L02222 Furuncle of back [any part, except buttock]: Secondary | ICD-10-CM | POA: Diagnosis not present

## 2016-04-07 MED ORDER — ACETAMINOPHEN-CODEINE #3 300-30 MG PO TABS: 2.0000 | ORAL_TABLET | ORAL | 0 refills | Status: DC | PRN

## 2016-04-07 MED ORDER — CEPHALEXIN 500 MG PO CAPS: 500.0000 mg | ORAL_CAPSULE | Freq: Four times a day (QID) | ORAL | 0 refills | Status: DC

## 2016-04-07 NOTE — Progress Notes (Signed)
Patient ID: [id number], female   DOB: 01/29/1956, 61 y.o.   MRN: [id number]    [facility] Visit  @DATE @            Patient name: Karina Kirk MRN [id number]  Date of birth: 05-01-1955  CC & HPI:   Chief Complaint  Patient presents with  . Recurrent Skin Infections    lance boil on back     Karina Kirk is a 61 y.o. female presenting today for a gradually worsening area of swelling on her left upper back. Pt reports h/o similar areas. No additional complaints  ROS:  ROS +boil left upper back sebaceous cyst   Pertinent History Reviewed:   Reviewed: Significant for DM Medical         Past Medical History:  Diagnosis Date  . Diabetes mellitus without complication ([place])   . Elevated cholesterol   . Hypertension   . Vaginal discharge 06/22/2015                              Surgical Hx:    Past Surgical History:  Procedure Laterality Date  . ABDOMINAL HYSTERECTOMY     Medications: Reviewed & Updated - see associated section                       Current Outpatient Prescriptions:  .  amLODipine-valsartan (EXFORGE) 10-320 MG tablet, Take 1 tablet by mouth daily. , Disp: , Rfl: 0 .  ezetimibe-simvastatin (VYTORIN) 10-20 MG tablet, Take 1 tablet by mouth daily., Disp: , Rfl:  .  glipiZIDE (GLUCOTROL XL) 10 MG 24 hr tablet, Take 10 mg by mouth daily with breakfast. , Disp: , Rfl: 0 .  hydrochlorothiazide (MICROZIDE) 12.5 MG capsule, Take 12.5 mg by mouth daily. , Disp: , Rfl: 0 .  HYDROcodone-acetaminophen (NORCO/VICODIN) 5-325 MG tablet, 1 or 2 tabs PO q6 hours prn pain, Disp: 12 tablet, Rfl: 0 .  JANUMET XR [phone] MG TB24, Take 1 tablet by mouth daily., Disp: , Rfl: 0 .  methocarbamol (ROBAXIN) 500 MG tablet, Take 2 tablets (1,000 mg total) by mouth 4 (four) times daily as needed for muscle spasms (muscle spasm/pain)., Disp: 25 tablet, Rfl: 0 .  nystatin-triamcinolone ointment (MYCOLOG), Apply 1 application topically 2 (two) times daily. (Patient not taking: Reported  on 04/07/2016), Disp: 30 g, Rfl: 0   Social History: Reviewed -  reports that she has never smoked. She has never used smokeless tobacco.  Objective Findings:  Vitals: Blood pressure 106/68, pulse 80, weight 151 lb 9.6 oz (68.8 kg).  Physical Examination: General appearance - alert, well appearing, and in no distress Mental status - alert, oriented to person, place, and time Back exam - full range of motion, no tenderness, palpable spasm or pain on motion. 2 cm x 3 cm sebaceous cyst left upper back.   CYST REMOVAL PROCEDURE NOTE: Patient identification was confirmed and verbal consent was obtained. This procedure was performed by Karina Kind, MD at 12:28 PM. Site: left upper back Sterile procedures observed Needle size: 25 g  Anesthetic used (type and amt): 10 cc 1% lidocaine without epi, 10 cc 1% with epi   Blade size: 15 Incision: Elliptical  2 cm wide by 2 cm deep sebaceous cyst successfully taken out. Complexity: Complex 2 layer closure as detailed below.   Deep closure: 3-0 Vicryl  Sutures: 2 vertical mattress   Superficial closure:  4-0 Prolene, simple interrupted  Sutures: 2 vertical mattress  Site anesthetized, incision made over site, wound drained and explored loculations, sebaceous cyst removed, incision closed in 2 layers, covered with dry, sterile dressing.  Pt tolerated procedure well without complications.  Instructions for care discussed verbally and pt provided with additional written instructions for homecare and f/u.   Assessment & Plan:   A:  1. Sebaceous cyst removed in office   P:  1. Rx Keflex and Tylenol #3 2. F/u for suture removal in 7 days      By signing my name below, I, Hansel Feinstein, attest that this documentation has been prepared under the direction and in the presence of Karina Kind, MD. Electronically Signed: Hansel Feinstein, ED Scribe. 04/07/16. 12:28 PM.  I personally performed the services described in this documentation, which was  SCRIBED in my presence. The recorded information has been reviewed and considered accurate. It has been edited as necessary during review. Karina Kind, MD

## 2016-04-08 ENCOUNTER — Ambulatory Visit (HOSPITAL_COMMUNITY): Payer: BLUE CROSS/BLUE SHIELD | Admitting: Physical Therapy

## 2016-04-08 DIAGNOSIS — M542 Cervicalgia: Secondary | ICD-10-CM

## 2016-04-08 DIAGNOSIS — M6281 Muscle weakness (generalized): Secondary | ICD-10-CM

## 2016-04-08 DIAGNOSIS — M25551 Pain in right hip: Secondary | ICD-10-CM

## 2016-04-08 DIAGNOSIS — M25511 Pain in right shoulder: Secondary | ICD-10-CM
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
## 2016-04-10 ENCOUNTER — Encounter (HOSPITAL_COMMUNITY): Payer: Self-pay | Admitting: Physical Therapy

## 2016-04-11 ENCOUNTER — Ambulatory Visit (HOSPITAL_COMMUNITY): Payer: BLUE CROSS/BLUE SHIELD

## 2016-04-11 DIAGNOSIS — M542 Cervicalgia: Secondary | ICD-10-CM

## 2016-04-11 DIAGNOSIS — M25551 Pain in right hip: Secondary | ICD-10-CM

## 2016-04-11 DIAGNOSIS — M6281 Muscle weakness (generalized): Secondary | ICD-10-CM

## 2016-04-11 DIAGNOSIS — M25511 Pain in right shoulder: Secondary | ICD-10-CM
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
## 2016-04-15 ENCOUNTER — Ambulatory Visit (HOSPITAL_COMMUNITY): Payer: BLUE CROSS/BLUE SHIELD | Admitting: Physical Therapy

## 2016-04-15 DIAGNOSIS — M25551 Pain in right hip: Secondary | ICD-10-CM

## 2016-04-15 DIAGNOSIS — M542 Cervicalgia: Secondary | ICD-10-CM

## 2016-04-15 DIAGNOSIS — M6281 Muscle weakness (generalized): Secondary | ICD-10-CM

## 2016-04-15 DIAGNOSIS — M25511 Pain in right shoulder: Secondary | ICD-10-CM
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
## 2016-04-16 ENCOUNTER — Ambulatory Visit (INDEPENDENT_AMBULATORY_CARE_PROVIDER_SITE_OTHER): Payer: BLUE CROSS/BLUE SHIELD | Admitting: Obstetrics and Gynecology

## 2016-04-16 ENCOUNTER — Encounter: Payer: Self-pay | Admitting: Obstetrics and Gynecology

## 2016-04-16 VITALS — BP 101/85 | HR 81 | Wt 153.0 lb

## 2016-04-16 DIAGNOSIS — L723 Sebaceous cyst: Secondary | ICD-10-CM

## 2016-04-16 DIAGNOSIS — Z4802 Encounter for removal of sutures: Secondary | ICD-10-CM

## 2016-04-16 NOTE — Progress Notes (Signed)
[facility] Visit  04/16/16         Patient name: Karina Kirk MRN [id number]  Date of birth: 1955/05/07  CC & HPI:  Karina Kirk is a 61 y.o. female presenting today for wound check. She was seen in the office on 04/07/16 and had sutures placed after a sebaceous cyst removal. She has been compliant with the prescribed antibiotics. She denies any issues with the healing area.   ROS:  ROS +healing skin from sebaceous cyst removal  Pertinent History Reviewed:   Reviewed: Significant for  Medical         Past Medical History:  Diagnosis Date  . Diabetes mellitus without complication ([place])   . Elevated cholesterol   . Hypertension   . Sebaceous cyst   . Vaginal discharge 06/22/2015                              Surgical Hx:    Past Surgical History:  Procedure Laterality Date  . ABDOMINAL HYSTERECTOMY     Medications: Reviewed & Updated - see associated section                       Current Outpatient Prescriptions:  .  acetaminophen-codeine (TYLENOL #3) 300-30 MG tablet, Take 2 tablets by mouth every 4 (four) hours as needed for moderate pain., Disp: 20 tablet, Rfl: 0 .  amLODipine-valsartan (EXFORGE) 10-320 MG tablet, Take 1 tablet by mouth daily. , Disp: , Rfl: 0 .  cephALEXin (KEFLEX) 500 MG capsule, Take 1 capsule (500 mg total) by mouth 4 (four) times daily., Disp: 28 capsule, Rfl: 0 .  ezetimibe-simvastatin (VYTORIN) 10-20 MG tablet, Take 1 tablet by mouth daily., Disp: , Rfl:  .  glipiZIDE (GLUCOTROL XL) 10 MG 24 hr tablet, Take 10 mg by mouth daily with breakfast. , Disp: , Rfl: 0 .  hydrochlorothiazide (MICROZIDE) 12.5 MG capsule, Take 12.5 mg by mouth daily. , Disp: , Rfl: 0 .  JANUMET XR [id number] MG TB24, Take 1 tablet by mouth daily., Disp: , Rfl: 0 .  HYDROcodone-acetaminophen (NORCO/VICODIN) 5-325 MG tablet, 1 or 2 tabs PO q6 hours prn pain (Patient not taking: Reported on 04/16/2016), Disp: 12 tablet, Rfl: 0 .  methocarbamol (ROBAXIN) 500 MG tablet, Take 2  tablets (1,000 mg total) by mouth 4 (four) times daily as needed for muscle spasms (muscle spasm/pain). (Patient not taking: Reported on 04/16/2016), Disp: 25 tablet, Rfl: 0 .  nystatin-triamcinolone ointment (MYCOLOG), Apply 1 application topically 2 (two) times daily. (Patient not taking: Reported on 04/07/2016), Disp: 30 g, Rfl: 0   Social History: Reviewed -  reports that she has never smoked. She has never used smokeless tobacco.  Objective Findings:  Vitals: Blood pressure 101/85, pulse 81, weight 153 lb (69.4 kg).  Physical Examination: Back: well healing area to left shoulder blade area, no erythema  SUTURE REMOVAL Performed by: Jonnie Kind, MD Authorized by: Jonnie Kind, MD Consent: Verbal consent obtained. Consent given by: patient Required items: required blood products, implants, devices, and special equipment available  Time out: Immediately prior to procedure a "time out" was called to verify the correct patient, procedure, equipment, support staff and site/side marked as required. Location: left shoulder blade Wound Appearance:  Healing well with some fibrosis Sutures Removed: 2 Post-removal: steri strips Patient tolerance: Patient tolerated the procedure well with no immediate complications.    Assessment &  Plan:   A:  1. Sutures removal  P:  1. Follow up PRN   By signing my name below, I, Sonum Patel, attest that this documentation has been prepared under the direction and in the presence of Jonnie Kind, MD. Electronically Signed: Sonum Patel, Education administrator. 04/16/16. 11:57 AM.  I personally performed the services described in this documentation, which was SCRIBED in my presence. The recorded information has been reviewed and considered accurate. It has been edited as necessary during review. Jonnie Kind, MD

## 2016-04-17 ENCOUNTER — Ambulatory Visit (INDEPENDENT_AMBULATORY_CARE_PROVIDER_SITE_OTHER): Payer: BLUE CROSS/BLUE SHIELD | Admitting: Orthopaedic Surgery

## 2016-04-17 ENCOUNTER — Encounter (INDEPENDENT_AMBULATORY_CARE_PROVIDER_SITE_OTHER): Payer: Self-pay | Admitting: Orthopaedic Surgery

## 2016-04-17 ENCOUNTER — Ambulatory Visit (HOSPITAL_COMMUNITY): Payer: BLUE CROSS/BLUE SHIELD | Admitting: Physical Therapy

## 2016-04-17 VITALS — BP 106/70 | HR 99 | Ht 66.0 in | Wt 153.0 lb

## 2016-04-17 DIAGNOSIS — G8929 Other chronic pain: Secondary | ICD-10-CM

## 2016-04-17 DIAGNOSIS — M25551 Pain in right hip: Secondary | ICD-10-CM | POA: Diagnosis not present

## 2016-04-17 DIAGNOSIS — M6281 Muscle weakness (generalized): Secondary | ICD-10-CM

## 2016-04-17 DIAGNOSIS — M542 Cervicalgia: Secondary | ICD-10-CM

## 2016-04-17 DIAGNOSIS — M25511 Pain in right shoulder: Secondary | ICD-10-CM | POA: Diagnosis not present

## 2016-04-17 MED ORDER — METHYLPREDNISOLONE ACETATE 40 MG/ML IJ SUSP
40.0000 mg | INTRAMUSCULAR | Status: AC | PRN
  Administered 2016-04-17: 40 mg via INTRA_ARTICULAR

## 2016-04-17 MED ORDER — BUPIVACAINE HCL 0.25 % IJ SOLN
4.0000 mL | INTRAMUSCULAR | Status: AC | PRN
  Administered 2016-04-17: 4 mL via INTRA_ARTICULAR

## 2016-04-17 MED ORDER — LIDOCAINE HCL 1 % IJ SOLN
1.0000 mL | INTRAMUSCULAR | Status: AC | PRN
  Administered 2016-04-17: 1 mL
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
## 2016-04-22 ENCOUNTER — Ambulatory Visit (HOSPITAL_COMMUNITY): Payer: BLUE CROSS/BLUE SHIELD | Admitting: Physical Therapy

## 2016-04-22 DIAGNOSIS — M25511 Pain in right shoulder: Secondary | ICD-10-CM

## 2016-04-22 DIAGNOSIS — M542 Cervicalgia: Secondary | ICD-10-CM

## 2016-04-22 DIAGNOSIS — M25551 Pain in right hip: Secondary | ICD-10-CM | POA: Diagnosis not present

## 2016-04-22 DIAGNOSIS — M6281 Muscle weakness (generalized): Secondary | ICD-10-CM
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
## 2016-04-22 NOTE — Patient Instructions (Signed)
2x10 reps

## 2016-04-24 ENCOUNTER — Ambulatory Visit (HOSPITAL_COMMUNITY): Payer: BLUE CROSS/BLUE SHIELD | Attending: Orthopaedic Surgery

## 2016-04-24 DIAGNOSIS — M6281 Muscle weakness (generalized): Secondary | ICD-10-CM | POA: Diagnosis present

## 2016-04-24 DIAGNOSIS — M542 Cervicalgia: Secondary | ICD-10-CM

## 2016-04-24 DIAGNOSIS — M25511 Pain in right shoulder: Secondary | ICD-10-CM | POA: Insufficient documentation

## 2016-04-24 DIAGNOSIS — M25551 Pain in right hip: Secondary | ICD-10-CM | POA: Diagnosis present
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
## 2016-04-29 ENCOUNTER — Ambulatory Visit (HOSPITAL_COMMUNITY): Payer: BLUE CROSS/BLUE SHIELD | Admitting: Physical Therapy

## 2016-04-29 ENCOUNTER — Telehealth (HOSPITAL_COMMUNITY): Payer: Self-pay | Admitting: Internal Medicine

## 2016-04-29 NOTE — Telephone Encounter (Signed)
04/29/16 pt cx this week.  She said that her sister passed away and she was going to [place]

## 2016-04-30 ENCOUNTER — Telehealth (INDEPENDENT_AMBULATORY_CARE_PROVIDER_SITE_OTHER): Payer: Self-pay

## 2016-04-30 NOTE — Telephone Encounter (Signed)
Received voicemail from adj requesting notes on this wc patient from October 2017 to present. Faxed last 3 notes to [phone].

## 2016-05-01 ENCOUNTER — Ambulatory Visit (HOSPITAL_COMMUNITY): Payer: BLUE CROSS/BLUE SHIELD | Admitting: Physical Therapy

## 2016-05-06 ENCOUNTER — Ambulatory Visit (HOSPITAL_COMMUNITY): Payer: BLUE CROSS/BLUE SHIELD

## 2016-05-06 DIAGNOSIS — M25551 Pain in right hip: Secondary | ICD-10-CM

## 2016-05-06 DIAGNOSIS — M6281 Muscle weakness (generalized): Secondary | ICD-10-CM

## 2016-05-06 DIAGNOSIS — M25511 Pain in right shoulder: Secondary | ICD-10-CM

## 2016-05-06 DIAGNOSIS — M542 Cervicalgia: Secondary | ICD-10-CM
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
## 2016-05-08 ENCOUNTER — Ambulatory Visit (HOSPITAL_COMMUNITY): Payer: BLUE CROSS/BLUE SHIELD | Admitting: Physical Therapy

## 2016-05-08 DIAGNOSIS — M6281 Muscle weakness (generalized): Secondary | ICD-10-CM

## 2016-05-08 DIAGNOSIS — M542 Cervicalgia: Secondary | ICD-10-CM

## 2016-05-08 DIAGNOSIS — M25551 Pain in right hip: Secondary | ICD-10-CM

## 2016-05-08 DIAGNOSIS — M25511 Pain in right shoulder: Secondary | ICD-10-CM
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
## 2016-05-12 ENCOUNTER — Ambulatory Visit (HOSPITAL_COMMUNITY): Payer: BLUE CROSS/BLUE SHIELD | Admitting: Physical Therapy

## 2016-05-12 DIAGNOSIS — M25551 Pain in right hip: Secondary | ICD-10-CM

## 2016-05-12 DIAGNOSIS — M6281 Muscle weakness (generalized): Secondary | ICD-10-CM

## 2016-05-12 DIAGNOSIS — M25511 Pain in right shoulder: Secondary | ICD-10-CM

## 2016-05-12 DIAGNOSIS — M542 Cervicalgia: Secondary | ICD-10-CM
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
## 2016-05-15 ENCOUNTER — Ambulatory Visit (HOSPITAL_COMMUNITY): Payer: BLUE CROSS/BLUE SHIELD

## 2016-05-15 DIAGNOSIS — M6281 Muscle weakness (generalized): Secondary | ICD-10-CM

## 2016-05-15 DIAGNOSIS — M542 Cervicalgia: Secondary | ICD-10-CM

## 2016-05-15 DIAGNOSIS — M25551 Pain in right hip: Secondary | ICD-10-CM

## 2016-05-15 DIAGNOSIS — M25511 Pain in right shoulder: Secondary | ICD-10-CM
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
## 2016-05-19 ENCOUNTER — Telehealth (HOSPITAL_COMMUNITY): Payer: Self-pay | Admitting: Physical Therapy

## 2016-05-19 ENCOUNTER — Ambulatory Visit (HOSPITAL_COMMUNITY): Payer: BLUE CROSS/BLUE SHIELD | Admitting: Physical Therapy

## 2016-05-19 NOTE — Telephone Encounter (Signed)
No show. Spoke with pt who thought her appointment was scheduled for tomorrow. She confirmed her next appointment for 05/22/16.   6:11 PM,05/19/16 Elly Modena PT, [facility] [phone]

## 2016-05-22 ENCOUNTER — Ambulatory Visit (HOSPITAL_COMMUNITY): Payer: BLUE CROSS/BLUE SHIELD | Attending: Orthopaedic Surgery | Admitting: Physical Therapy

## 2016-05-22 DIAGNOSIS — M6281 Muscle weakness (generalized): Secondary | ICD-10-CM | POA: Diagnosis present

## 2016-05-22 DIAGNOSIS — M25511 Pain in right shoulder: Secondary | ICD-10-CM | POA: Diagnosis present

## 2016-05-22 DIAGNOSIS — M25551 Pain in right hip: Secondary | ICD-10-CM | POA: Insufficient documentation

## 2016-05-22 DIAGNOSIS — M542 Cervicalgia: Secondary | ICD-10-CM | POA: Insufficient documentation
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
## 2016-05-27 ENCOUNTER — Encounter (HOSPITAL_COMMUNITY): Payer: Self-pay

## 2016-05-28 ENCOUNTER — Ambulatory Visit (HOSPITAL_COMMUNITY): Payer: BLUE CROSS/BLUE SHIELD

## 2016-05-28 DIAGNOSIS — M6281 Muscle weakness (generalized): Secondary | ICD-10-CM

## 2016-05-28 DIAGNOSIS — M25551 Pain in right hip: Secondary | ICD-10-CM

## 2016-05-28 DIAGNOSIS — M542 Cervicalgia: Secondary | ICD-10-CM

## 2016-05-28 DIAGNOSIS — M25511 Pain in right shoulder: Secondary | ICD-10-CM
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
## 2016-05-29 ENCOUNTER — Encounter (HOSPITAL_COMMUNITY): Payer: Self-pay

## 2016-06-03 ENCOUNTER — Encounter (HOSPITAL_COMMUNITY): Payer: Self-pay | Admitting: Physical Therapy

## 2016-06-05 ENCOUNTER — Ambulatory Visit (HOSPITAL_COMMUNITY): Payer: BLUE CROSS/BLUE SHIELD | Admitting: Physical Therapy

## 2016-06-05 DIAGNOSIS — M25511 Pain in right shoulder: Secondary | ICD-10-CM

## 2016-06-05 DIAGNOSIS — M542 Cervicalgia: Secondary | ICD-10-CM

## 2016-06-05 DIAGNOSIS — M6281 Muscle weakness (generalized): Secondary | ICD-10-CM

## 2016-06-05 DIAGNOSIS — M25551 Pain in right hip: Secondary | ICD-10-CM | POA: Diagnosis not present
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
## 2016-07-17 ENCOUNTER — Ambulatory Visit (INDEPENDENT_AMBULATORY_CARE_PROVIDER_SITE_OTHER): Payer: BLUE CROSS/BLUE SHIELD | Admitting: Orthopaedic Surgery

## 2016-07-17 ENCOUNTER — Encounter (INDEPENDENT_AMBULATORY_CARE_PROVIDER_SITE_OTHER): Payer: Self-pay | Admitting: Orthopaedic Surgery

## 2016-07-17 ENCOUNTER — Other Ambulatory Visit (INDEPENDENT_AMBULATORY_CARE_PROVIDER_SITE_OTHER): Payer: Self-pay | Admitting: Orthopaedic Surgery

## 2016-07-17 VITALS — BP 118/78 | HR 81 | Ht 66.0 in | Wt 158.0 lb

## 2016-07-17 DIAGNOSIS — M25511 Pain in right shoulder: Secondary | ICD-10-CM

## 2016-07-17 DIAGNOSIS — M7501 Adhesive capsulitis of right shoulder: Secondary | ICD-10-CM | POA: Diagnosis not present
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
## 2016-07-22 ENCOUNTER — Ambulatory Visit (INDEPENDENT_AMBULATORY_CARE_PROVIDER_SITE_OTHER): Payer: BLUE CROSS/BLUE SHIELD | Admitting: Orthopaedic Surgery

## 2016-07-22 ENCOUNTER — Ambulatory Visit (HOSPITAL_COMMUNITY)
Admission: RE | Admit: 2016-07-22 | Discharge: 2016-07-22 | Disposition: A | Discharge status: Home / Self Care | Payer: BLUE CROSS/BLUE SHIELD | Source: Ambulatory Visit | Attending: Orthopaedic Surgery | Admitting: Orthopaedic Surgery

## 2016-07-22 DIAGNOSIS — M25511 Pain in right shoulder: Secondary | ICD-10-CM

## 2016-07-22 DIAGNOSIS — M7581 Other shoulder lesions, right shoulder: Secondary | ICD-10-CM | POA: Insufficient documentation

## 2016-07-22 DIAGNOSIS — M19011 Primary osteoarthritis, right shoulder: Secondary | ICD-10-CM | POA: Diagnosis not present

## 2016-07-22 DIAGNOSIS — M75122 Complete rotator cuff tear or rupture of left shoulder, not specified as traumatic: Secondary | ICD-10-CM | POA: Diagnosis not present

## 2016-07-24 ENCOUNTER — Ambulatory Visit (INDEPENDENT_AMBULATORY_CARE_PROVIDER_SITE_OTHER): Payer: BLUE CROSS/BLUE SHIELD | Admitting: Orthopaedic Surgery

## 2016-08-07 ENCOUNTER — Encounter (INDEPENDENT_AMBULATORY_CARE_PROVIDER_SITE_OTHER): Payer: Self-pay | Admitting: Orthopaedic Surgery

## 2016-08-07 ENCOUNTER — Ambulatory Visit (INDEPENDENT_AMBULATORY_CARE_PROVIDER_SITE_OTHER): Payer: BLUE CROSS/BLUE SHIELD | Admitting: Orthopaedic Surgery

## 2016-08-07 VITALS — BP 112/78 | HR 84 | Ht 66.0 in | Wt 154.0 lb

## 2016-08-07 DIAGNOSIS — M75121 Complete rotator cuff tear or rupture of right shoulder, not specified as traumatic: Secondary | ICD-10-CM | POA: Diagnosis not present
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
## 2016-08-22 ENCOUNTER — Telehealth (INDEPENDENT_AMBULATORY_CARE_PROVIDER_SITE_OTHER): Payer: Self-pay | Admitting: Orthopaedic Surgery

## 2016-08-22 NOTE — Telephone Encounter (Signed)
I received a medical records release form, however was not valid. Patient filled in authorizing "[employer] [employer]", And it needs to name [employer]. I returned form to her with a new form

## 2016-08-29 ENCOUNTER — Telehealth (INDEPENDENT_AMBULATORY_CARE_PROVIDER_SITE_OTHER): Payer: Self-pay | Admitting: Orthopaedic Surgery

## 2016-08-29 NOTE — Telephone Encounter (Signed)
RECORDS FAXED TO [facility]

## 2016-11-20 ENCOUNTER — Ambulatory Visit (HOSPITAL_COMMUNITY): Payer: BLUE CROSS/BLUE SHIELD | Attending: Specialist | Admitting: Physical Therapy

## 2016-11-20 ENCOUNTER — Encounter (HOSPITAL_COMMUNITY): Payer: Self-pay | Admitting: Physical Therapy

## 2016-11-20 DIAGNOSIS — M25611 Stiffness of right shoulder, not elsewhere classified: Secondary | ICD-10-CM | POA: Diagnosis present

## 2016-11-20 DIAGNOSIS — R293 Abnormal posture: Secondary | ICD-10-CM | POA: Diagnosis present

## 2016-11-20 DIAGNOSIS — G8929 Other chronic pain: Secondary | ICD-10-CM | POA: Insufficient documentation

## 2016-11-20 DIAGNOSIS — M6281 Muscle weakness (generalized): Secondary | ICD-10-CM

## 2016-11-20 DIAGNOSIS — M25511 Pain in right shoulder: Secondary | ICD-10-CM | POA: Insufficient documentation

## 2016-11-20 NOTE — Patient Instructions (Signed)
   SCAPULAR RETRACTIONS  Draw your shoulder blades back and down. It should feel like you are squeezing your shoulder blades together.  Hold for 2 seconds.  Repeat 10-15 times, at least 3 times per day.    RETRACTION / CHIN TUCK  Slowly draw your head back so that your ears line up with your shoulders.  Think about pulling away from a plate of food you don't like.  Repeat 10-15 times, at least 3 times per day.    BICEP CURLS  With your arm at your side, draw up your hand by bending at the elbow.   Keep your palm face up the entire time.  Make sure the motion is just at the elbow, NOT the shoulder for now.  Repeat 10-15 times, at least 3 times per day.     WRIST FLEXION - AROM - THIGH  Rest your arm on your thigh and bend at your wrist up and down with your palm face up as shown. Return to original position and repeat 15 times, at least 3 times per day.

## 2016-11-20 NOTE — Therapy (Signed)
[place], [place], [address] Phone: [phone]   Fax:  [phone]  Physical Therapy Evaluation  Patient Details  Name: Karina Kirk MRN: [id number] Date of Birth: October 01, 1955 Referring Provider: Sydnee Cabal   Encounter Date: 11/20/2016      PT End of Session - 11/20/16 1158    Visit Number 1   Number of Visits 17   Date for PT Re-Evaluation 12/18/16   Authorization Type BCBS Other    Authorization Time Period 11/20/16 to 01/20/17   PT Start Time 0816   PT Stop Time 0855   PT Time Calculation (min) 39 min   Activity Tolerance Patient tolerated treatment well;No increased pain   Behavior During Therapy WFL for tasks assessed/performed      Past Medical History:  Diagnosis Date  . Diabetes mellitus without complication ([place])   . Elevated cholesterol   . Hypertension   . Sebaceous cyst   . Vaginal discharge 06/22/2015    Past Surgical History:  Procedure Laterality Date  . ABDOMINAL HYSTERECTOMY      There were no vitals filed for this visit.       Subjective Assessment - 11/20/16 0818    Subjective patient had been receiving treatment for her low back and shoulder at this clinic; she was discharged in March of 2018, and had rotator cuff repair 11/13/16. She is in her sling for now, things are going fine otherwise. She does not have a lot of pain and her neck feels OK especially after surgery.    Pertinent History DM, HTN    Patient Stated Goals get out of the sling and get back to moving well    Currently in Pain? No/denies            [facility] PT Assessment - 11/20/16 0001      Assessment   Medical Diagnosis complete R RTC tear    Referring Provider Sydnee Cabal    Onset Date/Surgical Date 11/13/16   Next MD Visit Dr. Theda Sers 12/15/16   Prior Therapy PT for her back and shoulder earlier this year      Precautions   Precautions Shoulder   Precaution Comments using [facility]  protocol      Balance Screen   Has the patient fallen in the past 6 months No   Has the patient had a decrease in activity level because of a fear of falling?  No   Is the patient reluctant to leave their home because of a fear of falling?  No     Prior Function   Level of Independence Independent;Independent with basic ADLs;Independent with gait;Independent with transfers   Vocation Full time employment   [employer]     Posture/Postural Control   Posture/Postural Control Postural limitations   Postural Limitations Rounded Shoulders;Forward head     ROM / Strength   AROM / PROM / Strength PROM     PROM   Right Shoulder Flexion 90 Degrees  limited per MD protocol    Right Shoulder ABduction 76 Degrees  per patient tolerance    Right Shoulder Internal Rotation --  full- able to place hand on stomach    Right Shoulder External Rotation 30 Degrees  at 40 degrees ABD      Strength   Overall Strength Comments grip strengh appears equal and functional B    Right Shoulder Flexion --  DNT    Right Shoulder ABduction --  DNT  Right Shoulder Internal Rotation --  DNT    Right Shoulder External Rotation --  DNT    Left Shoulder Flexion 5/5   Left Shoulder ABduction 5/5   Left Shoulder Internal Rotation 5/5   Left Shoulder External Rotation 5/5   Right Elbow Flexion 4+/5   Right Elbow Extension 4/5   Left Elbow Flexion 4+/5   Left Elbow Extension 4-/5   Right Wrist Flexion 5/5   Right Wrist Extension 5/5   Left Wrist Flexion 5/5   Left Wrist Extension 5/5     Palpation   Palpation comment knotting noted shoulder girdles and upper traps but not painful             Objective measurements completed on examination: See above findings.                  PT Education - 11/20/16 1158    Education provided Yes   Education Details typical course of care following RTC repair, exam findings, prognosis, HEP, importance of following MD  precautions    Person(s) Educated Patient   Methods Explanation;Demonstration;Handout   Comprehension Verbalized understanding;Returned demonstration;Need further instruction          PT Short Term Goals - 11/20/16 1203      PT SHORT TERM GOAL #1   Title Patient to be compliant with correct performance of HEP, to be updated as protocol allows    Time 1   Period Weeks   Status New   Target Date 11/27/16     PT SHORT TERM GOAL #2   Title Patient to be able to maintain correct posture at least 75% of the time without external cues in order to maintain correct mechanics and prevent pain exacerbation    Time 4   Period Weeks   Status New   Target Date 12/18/16     PT SHORT TERM GOAL #3   Title Patient to demonstate PROM as being full on all planes (as allowed per protocol) with no pain increase in order to show improvement of condition    Time 4   Period Weeks   Status New           PT Long Term Goals - 11/20/16 1205      PT LONG TERM GOAL #1   Title Patient to demonstrate AAROM as being full in all planes in order to show improvement of condition and good mobilty of shoulder complex    Time 8   Period Weeks   Status New   Target Date 01/15/17     PT LONG TERM GOAL #2   Title Patient to be able to perform AROM to at least 80% of full on all planes without major compensations in order to show improvement of condition and improving functional task tolerance    Time 8   Period Weeks   Status New     PT LONG TERM GOAL #3   Title Patient to be able to perform all selfcare and dressing activities with no increase in pain in order to improve functional task performance    Time 8   Period Weeks   Status New     PT LONG TERM GOAL #4   Title Patient to demonstrate MMT as being at least 4/5 in all tested R shoulder muscles in order to improve functional task performance    Time 8   Period Weeks   Status New  Plan - 11/20/16 1159    Clinical  Impression Statement Patient arrives exactly 1 week after surgery for R rotator cuff repair; she states that she is actually feeling much better following her surgery and really has not been having any pain whatsoever. Examination reveals typical presentation follow this surgery and including ongoing poor posture, mild functional strength deficits in UEs (R UE not tested with MMT due to restrictions of protocol), limited ROM R shoulder, and reduced ability to tolerate and perform PLOF based tasks. Recommend skilled PT services to address functional deficits and assist in return to optimal level of function moving forward.    History and Personal Factors relevant to plan of care: rotator cuff repair 1 week ago    Clinical Presentation Stable   Clinical Presentation due to: post-op state    Clinical Decision Making Low   Rehab Potential Good   Clinical Impairments Affecting Rehab Potential (+) motivated to participate, moderate outcomes with PT in the past   PT Frequency 2x / week   PT Duration 8 weeks   PT Treatment/Interventions ADLs/Self Care Home Management;Biofeedback;Cryotherapy;Electrical Stimulation;Iontophoresis 4mg /ml Dexamethasone;Moist Heat;Ultrasound;Gait training;DME Instruction;Stair training;Functional mobility training;Therapeutic activities;Therapeutic exercise;Balance training;Neuromuscular re-education;Patient/family education;Manual techniques;Scar mobilization;Passive range of motion;Dry needling   PT Next Visit Plan review initial eval/goals, HEP; if patient able to perform correctly, add PROM standing pendulums; surgery done 8/23, follow [place] RTC protocol: pendulums, supine PROM with forward elevation to no more than 90 degrees, other PROM to tolerance, isometrics for cuff    PT Home Exercise Plan Eval: scapular retractions, chin tucks, 3D cervical excursions, biceps AROM, wrist AROM, grip strength squeezes    Consulted and Agree with Plan of Care Patient       Patient will benefit from skilled therapeutic intervention in order to improve the following deficits and impairments:  Decreased skin integrity, Improper body mechanics, Decreased coordination, Decreased mobility, Decreased scar mobility, Postural dysfunction, Decreased strength, Decreased range of motion, Hypomobility, Impaired UE functional use, Impaired flexibility  Visit Diagnosis: Chronic right shoulder pain - Plan: PT plan of care cert/re-cert  Abnormal posture - Plan: PT plan of care cert/re-cert  Muscle weakness (generalized) - Plan: PT plan of care cert/re-cert  Stiffness of right shoulder, not elsewhere classified - Plan: PT plan of care cert/re-cert     Problem List Patient Active Problem List   Diagnosis Date Noted  . Inflamed sebaceous cyst left shoulder blade 04/16/2016  . Vaginal discharge 06/22/2015  . Hypertension 11/23/2012  . Diabetes ([place]) 11/23/2012  . Elevated cholesterol 11/23/2012    Deniece Ree PT, DPT [place] [address] [place], [place], [address] Phone: [phone]   Fax:  [phone]  Name: Karina Kirk MRN: [id number] Date of Birth: 04/16/1955

## 2016-11-25 ENCOUNTER — Ambulatory Visit (HOSPITAL_COMMUNITY): Payer: BLUE CROSS/BLUE SHIELD | Attending: Specialist | Admitting: Physical Therapy

## 2016-11-25 DIAGNOSIS — R293 Abnormal posture: Secondary | ICD-10-CM | POA: Diagnosis present

## 2016-11-25 DIAGNOSIS — M25611 Stiffness of right shoulder, not elsewhere classified: Secondary | ICD-10-CM | POA: Diagnosis present

## 2016-11-25 DIAGNOSIS — M25511 Pain in right shoulder: Secondary | ICD-10-CM | POA: Diagnosis not present

## 2016-11-25 DIAGNOSIS — G8929 Other chronic pain: Secondary | ICD-10-CM | POA: Diagnosis present

## 2016-11-25 DIAGNOSIS — M6281 Muscle weakness (generalized): Secondary | ICD-10-CM | POA: Diagnosis present

## 2016-11-25 NOTE — Therapy (Signed)
[place], [place], [address] Phone: [phone]   Fax:  [phone]  Physical Therapy Treatment  Patient Details  Name: Karina Kirk MRN: [id number] Date of Birth: 16-Dec-1955 Referring Provider: Sydnee Cabal   Encounter Date: 11/25/2016      PT End of Session - 11/25/16 1642    Visit Number 2   Number of Visits 17   Date for PT Re-Evaluation 12/18/16   Authorization Type BCBS Other    Authorization Time Period 11/20/16 to 01/20/17   PT Start Time 9371   PT Stop Time 6967   PT Time Calculation (min) 36 min   Activity Tolerance Patient tolerated treatment well;No increased pain   Behavior During Therapy WFL for tasks assessed/performed      Past Medical History:  Diagnosis Date  . Diabetes mellitus without complication ([place])   . Elevated cholesterol   . Hypertension   . Sebaceous cyst   . Vaginal discharge 06/22/2015    Past Surgical History:  Procedure Laterality Date  . ABDOMINAL HYSTERECTOMY      There were no vitals filed for this visit.                       [facility] PT Treatment/Exercise - 11/25/16 0001      Shoulder Exercises: Supine   Other Supine Exercises PROM Rt shoulder flexion, ABD, ER/IR   Other Supine Exercises isometrics 5 reps each in supine     Shoulder Exercises: Standing   Other Standing Exercises pendulums all 20" each     Modalities   Modalities Cryotherapy     Cryotherapy   Number Minutes Cryotherapy 8 Minutes   Cryotherapy Location Shoulder  right   Type of Cryotherapy Ice pack     Manual Therapy   Manual Therapy Myofascial release   Manual therapy comments completed prior to therex and stretching and seperate from all other skilled interventions   Myofascial Release supine to Rt shoulder to decrease scar tissue and adhesions                PT Education - 11/25/16 1641    Education provided Yes   Education Details evaluation  review, goals and HEP.     Person(s) Educated Patient   Methods Explanation;Demonstration;Tactile cues;Verbal cues;Handout   Comprehension Verbalized understanding;Returned demonstration;Verbal cues required;Tactile cues required          PT Short Term Goals - 11/20/16 1203      PT SHORT TERM GOAL #1   Title Patient to be compliant with correct performance of HEP, to be updated as protocol allows    Time 1   Period Weeks   Status New   Target Date 11/27/16     PT SHORT TERM GOAL #2   Title Patient to be able to maintain correct posture at least 75% of the time without external cues in order to maintain correct mechanics and prevent pain exacerbation    Time 4   Period Weeks   Status New   Target Date 12/18/16     PT SHORT TERM GOAL #3   Title Patient to demonstate PROM as being full on all planes (as allowed per protocol) with no pain increase in order to show improvement of condition    Time 4   Period Weeks   Status New           PT Long Term Goals - 11/20/16 1205  PT LONG TERM GOAL #1   Title Patient to demonstrate AAROM as being full in all planes in order to show improvement of condition and good mobilty of shoulder complex    Time 8   Period Weeks   Status New   Target Date 01/15/17     PT LONG TERM GOAL #2   Title Patient to be able to perform AROM to at least 80% of full on all planes without major compensations in order to show improvement of condition and improving functional task tolerance    Time 8   Period Weeks   Status New     PT LONG TERM GOAL #3   Title Patient to be able to perform all selfcare and dressing activities with no increase in pain in order to improve functional task performance    Time 8   Period Weeks   Status New     PT LONG TERM GOAL #4   Title Patient to demonstrate MMT as being at least 4/5 in all tested R shoulder muscles in order to improve functional task performance    Time 8   Period Weeks   Status New                Plan - 11/25/16 1642    Clinical Impression Statement PT able to demonstrate given HEP correctly, however noted poor seated posture with forward shoulders.  began session with instruction of pendulum exercises f/b manual including myofascial to Rt shoulder to reduce adhesions and scar tissue.    Began isometrics in supine and completed PROM to Right shoulder.  finished session wtih icepack to rt shoulder to decrease discomfort (not billable).   PT Next Visit Plan Surgery done 8/23, follow [place] RTC protocol: pendulums, supine PROM with forward elevation to no more than 90 degrees, other PROM to tolerance, isometrics for cuff       Patient will benefit from skilled therapeutic intervention in order to improve the following deficits and impairments:     Visit Diagnosis: Chronic right shoulder pain  Abnormal posture  Muscle weakness (generalized)     Problem List Patient Active Problem List   Diagnosis Date Noted  . Inflamed sebaceous cyst left shoulder blade 04/16/2016  . Vaginal discharge 06/22/2015  . Hypertension 11/23/2012  . Diabetes ([place]) 11/23/2012  . Elevated cholesterol 11/23/2012    Teena Irani, PTA/CLT [id number]  Teena Irani 11/25/2016, 4:48 PM  [place] [address] [place], [place], [address] Phone: [phone]   Fax:  [phone]  Name: CANDIDA VETTER MRN: [id number] Date of Birth: [birth year]

## 2016-11-27 ENCOUNTER — Ambulatory Visit (HOSPITAL_COMMUNITY): Payer: BLUE CROSS/BLUE SHIELD

## 2016-11-27 DIAGNOSIS — M25511 Pain in right shoulder: Secondary | ICD-10-CM | POA: Diagnosis not present

## 2016-11-27 DIAGNOSIS — M6281 Muscle weakness (generalized): Secondary | ICD-10-CM

## 2016-11-27 DIAGNOSIS — M25611 Stiffness of right shoulder, not elsewhere classified: Secondary | ICD-10-CM

## 2016-11-27 DIAGNOSIS — G8929 Other chronic pain: Secondary | ICD-10-CM

## 2016-11-27 DIAGNOSIS — R293 Abnormal posture: Secondary | ICD-10-CM

## 2016-11-27 NOTE — Therapy (Signed)
[place], [place], [address] Phone: [phone]   Fax:  [phone]  Physical Therapy Treatment  Patient Details  Name: Karina Kirk MRN: [id number] Date of Birth: April 03, 1955 Referring Provider: Sydnee Cabal   Encounter Date: 11/27/2016      PT End of Session - 11/27/16 1503    Visit Number 3   Number of Visits 17   Date for PT Re-Evaluation 12/18/16   Authorization Type BCBS Other    Authorization Time Period 11/20/16 to 01/20/17   PT Start Time 1432   PT Stop Time 1514  EOS ice x 7 min (not included wiht billing)   PT Time Calculation (min) 42 min   Activity Tolerance Patient tolerated treatment well;No increased pain   Behavior During Therapy WFL for tasks assessed/performed      Past Medical History:  Diagnosis Date  . Diabetes mellitus without complication ([place])   . Elevated cholesterol   . Hypertension   . Sebaceous cyst   . Vaginal discharge 06/22/2015    Past Surgical History:  Procedure Laterality Date  . ABDOMINAL HYSTERECTOMY      There were no vitals filed for this visit.      Subjective Assessment - 11/27/16 1502    Subjective Pt reports her shoulder is feeling good today, no reports of pain today.  Pt arrived wearing sling for Rt UE.  Reports compliance iwht HEP daily.   Patient Stated Goals get out of the sling and get back to moving well    Currently in Pain? No/denies                         [facility] Adult PT Treatment/Exercise - 11/27/16 0001      Shoulder Exercises: Supine   Other Supine Exercises PROM Rt shoulder flexion, ABD, ER/IR   Other Supine Exercises isometrics 5 reps each in supine     Shoulder Exercises: Standing   Other Standing Exercises pendulums all directions     Cryotherapy   Number Minutes Cryotherapy 7 Minutes   Cryotherapy Location Shoulder   Type of Cryotherapy Ice pack     Manual Therapy   Manual Therapy Myofascial release;Passive  ROM   Manual therapy comments completed prior to therex and stretching and seperate from all other skilled interventions   Myofascial Release supine to Rt shoulder to decrease scar tissue and adhesions   Passive ROM Rt shoulder flexion, abd, IR/ER per pt tolerance                  PT Short Term Goals - 11/20/16 1203      PT SHORT TERM GOAL #1   Title Patient to be compliant with correct performance of HEP, to be updated as protocol allows    Time 1   Period Weeks   Status New   Target Date 11/27/16     PT SHORT TERM GOAL #2   Title Patient to be able to maintain correct posture at least 75% of the time without external cues in order to maintain correct mechanics and prevent pain exacerbation    Time 4   Period Weeks   Status New   Target Date 12/18/16     PT SHORT TERM GOAL #3   Title Patient to demonstate PROM as being full on all planes (as allowed per protocol) with no pain increase in order to show improvement of condition    Time 4  Period Weeks   Status New           PT Long Term Goals - 11/20/16 1205      PT LONG TERM GOAL #1   Title Patient to demonstrate AAROM as being full in all planes in order to show improvement of condition and good mobilty of shoulder complex    Time 8   Period Weeks   Status New   Target Date 01/15/17     PT LONG TERM GOAL #2   Title Patient to be able to perform AROM to at least 80% of full on all planes without major compensations in order to show improvement of condition and improving functional task tolerance    Time 8   Period Weeks   Status New     PT LONG TERM GOAL #3   Title Patient to be able to perform all selfcare and dressing activities with no increase in pain in order to improve functional task performance    Time 8   Period Weeks   Status New     PT LONG TERM GOAL #4   Title Patient to demonstrate MMT as being at least 4/5 in all tested R shoulder muscles in order to improve functional task performance     Time 8   Period Weeks   Status New               Plan - 11/27/16 1504    Clinical Impression Statement Session focus on shoulder mobility with manual soft tissue mobilization techniques and PROM per pt tolerance.  Reviewed proper form with pendulum and instructed to complete more passively than active movements, moderate cueing for form and technique.  Pt presents with poor seated posture with forward rolled shoulders and kyphotic thoracic presentation.  Pt educated on importance of proper posture to improve musculature lengthening and back pain control.  No reoprts of pain through session, did end with ice to shoulder for edema control.     Rehab Potential Good   Clinical Impairments Affecting Rehab Potential (+) motivated to participate, moderate outcomes with PT in the past   PT Frequency 2x / week   PT Duration 8 weeks   PT Treatment/Interventions ADLs/Self Care Home Management;Biofeedback;Cryotherapy;Electrical Stimulation;Iontophoresis 4mg /ml Dexamethasone;Moist Heat;Ultrasound;Gait training;DME Instruction;Stair training;Functional mobility training;Therapeutic activities;Therapeutic exercise;Balance training;Neuromuscular re-education;Patient/family education;Manual techniques;Scar mobilization;Passive range of motion;Dry needling   PT Next Visit Plan Surgery done 8/23, follow [facility] [facility] protocol: pendulums, supine PROM with forward elevation to no more than 90 degrees, other PROM to tolerance, isometrics for cuff    PT Home Exercise Plan Eval: scapular retractions, chin tucks, 3D cervical excursions, biceps AROM, wrist AROM, grip strength squeezes       Patient will benefit from skilled therapeutic intervention in order to improve the following deficits and impairments:  Decreased skin integrity, Improper body mechanics, Decreased coordination, Decreased mobility, Decreased scar mobility, Postural dysfunction, Decreased strength, Decreased range of motion,  Hypomobility, Impaired UE functional use, Impaired flexibility  Visit Diagnosis: Chronic right shoulder pain  Abnormal posture  Muscle weakness (generalized)  Stiffness of right shoulder, not elsewhere classified     Problem List Patient Active Problem List   Diagnosis Date Noted  . Inflamed sebaceous cyst left shoulder blade 04/16/2016  . Vaginal discharge 06/22/2015  . Hypertension 11/23/2012  . Diabetes ([place]) 11/23/2012  . Elevated cholesterol 11/23/2012   Ihor Austin, LPTA; [place]  Aldona Lento 11/27/2016, 3:11 PM  [address] [place]  [place], [place], [address] Phone: [phone]   Fax:  [phone]  Name: RENESSA WELLNITZ MRN: [id number] Date of Birth: [birth year]

## 2016-12-02 ENCOUNTER — Ambulatory Visit (HOSPITAL_COMMUNITY): Payer: BLUE CROSS/BLUE SHIELD

## 2016-12-02 DIAGNOSIS — G8929 Other chronic pain: Secondary | ICD-10-CM

## 2016-12-02 DIAGNOSIS — M25611 Stiffness of right shoulder, not elsewhere classified: Secondary | ICD-10-CM

## 2016-12-02 DIAGNOSIS — M25511 Pain in right shoulder: Principal | ICD-10-CM

## 2016-12-02 DIAGNOSIS — R293 Abnormal posture: Secondary | ICD-10-CM

## 2016-12-02 DIAGNOSIS — M6281 Muscle weakness (generalized): Secondary | ICD-10-CM

## 2016-12-02 NOTE — Therapy (Signed)
[place], [place], [address] Phone: [phone]   Fax:  [phone]  Physical Therapy Treatment  Patient Details  Name: Karina Kirk MRN: [id number] Date of Birth: [birth year] Referring Provider: Sydnee Cabal   Encounter Date: 12/02/2016      PT End of Session - 12/02/16 0910    Visit Number 4   Number of Visits 17   Date for PT Re-Evaluation 12/18/16   Authorization Type BCBS Other    Authorization Time Period 11/20/16 to 01/20/17   PT Start Time 0908   PT Stop Time 0946   PT Time Calculation (min) 38 min   Activity Tolerance Patient tolerated treatment well;No increased pain   Behavior During Therapy WFL for tasks assessed/performed      Past Medical History:  Diagnosis Date  . Diabetes mellitus without complication ([place])   . Elevated cholesterol   . Hypertension   . Sebaceous cyst   . Vaginal discharge 06/22/2015    Past Surgical History:  Procedure Laterality Date  . ABDOMINAL HYSTERECTOMY      There were no vitals filed for this visit.      Subjective Assessment - 12/02/16 0908    Subjective Pt reports no real pain in shoulder today,  Reports compliance with HEP daily.   Pertinent History DM, HTN    Patient Stated Goals get out of the sling and get back to moving well    Currently in Pain? No/denies                         [facility] Adult PT Treatment/Exercise - 12/02/16 0001      Shoulder Exercises: Supine   External Rotation PROM;10 reps   Theraband Level (Shoulder External Rotation) Other (comment)  wand   Other Supine Exercises PROM Rt shoulder flexion, ABD, ER/IR   Other Supine Exercises isometrics 5 reps each in supine     Shoulder Exercises: Seated   Other Seated Exercises scapular retraction   Other Seated Exercises bicep curls 10x; wrist AROM all directions 10x; grasp exercises with yellow putty x 2 min; 5 beads in putty      Manual Therapy   Manual Therapy  Myofascial release;Passive ROM   Manual therapy comments completed prior to therex and stretching and seperate from all other skilled interventions   Myofascial Release supine to Rt shoulder to decrease scar tissue and adhesions   Passive ROM Rt shoulder flexion, abd, IR/ER per pt tolerance                  PT Short Term Goals - 11/20/16 1203      PT SHORT TERM GOAL #1   Title Patient to be compliant with correct performance of HEP, to be updated as protocol allows    Time 1   Period Weeks   Status New   Target Date 11/27/16     PT SHORT TERM GOAL #2   Title Patient to be able to maintain correct posture at least 75% of the time without external cues in order to maintain correct mechanics and prevent pain exacerbation    Time 4   Period Weeks   Status New   Target Date 12/18/16     PT SHORT TERM GOAL #3   Title Patient to demonstate PROM as being full on all planes (as allowed per protocol) with no pain increase in order to show improvement of condition    Time  4   Period Weeks   Status New           PT Long Term Goals - 11/20/16 1205      PT LONG TERM GOAL #1   Title Patient to demonstrate AAROM as being full in all planes in order to show improvement of condition and good mobilty of shoulder complex    Time 8   Period Weeks   Status New   Target Date 01/15/17     PT LONG TERM GOAL #2   Title Patient to be able to perform AROM to at least 80% of full on all planes without major compensations in order to show improvement of condition and improving functional task tolerance    Time 8   Period Weeks   Status New     PT LONG TERM GOAL #3   Title Patient to be able to perform all selfcare and dressing activities with no increase in pain in order to improve functional task performance    Time 8   Period Weeks   Status New     PT LONG TERM GOAL #4   Title Patient to demonstrate MMT as being at least 4/5 in all tested R shoulder muscles in order to improve  functional task performance    Time 8   Period Weeks   Status New               Plan - 12/02/16 0944    Clinical Impression Statement Pt at 2 1/2 weeks post-op.  Session focus on shoulder mobility per protocol.  Continued with manual soft tissue mobilization and PROM.  Pt improving with shoulder mobility and improved tolerance with PROM this session.  Continues to have fascial restrictions anterior shoulder limiting mobilty, manual MFR technqiues complete to address limitation.  Added scapular retraction for posture strengthening and putty for grip strengthening.  No reports of pain.  Pt reports she has increased ice application for pain control at home.     Clinical Impairments Affecting Rehab Potential (+) motivated to participate, moderate outcomes with PT in the past   PT Frequency 2x / week   PT Duration 8 weeks   PT Treatment/Interventions ADLs/Self Care Home Management;Biofeedback;Cryotherapy;Electrical Stimulation;Iontophoresis 4mg /ml Dexamethasone;Moist Heat;Ultrasound;Gait training;DME Instruction;Stair training;Functional mobility training;Therapeutic activities;Therapeutic exercise;Balance training;Neuromuscular re-education;Patient/family education;Manual techniques;Scar mobilization;Passive range of motion;Dry needling   PT Next Visit Plan Surgery done 8/23, follow [facility] protocol: pendulums, supine PROM with forward elevation to no more than 90 degrees, other PROM to tolerance, isometrics for cuff    PT Home Exercise Plan Eval: scapular retractions, chin tucks, 3D cervical excursions, biceps AROM, wrist AROM, grip strength squeezes       Patient will benefit from skilled therapeutic intervention in order to improve the following deficits and impairments:  Decreased skin integrity, Improper body mechanics, Decreased coordination, Decreased mobility, Decreased scar mobility, Postural dysfunction, Decreased strength, Decreased range of motion, Hypomobility,  Impaired UE functional use, Impaired flexibility  Visit Diagnosis: Chronic right shoulder pain  Abnormal posture  Muscle weakness (generalized)  Stiffness of right shoulder, not elsewhere classified     Problem List Patient Active Problem List   Diagnosis Date Noted  . Inflamed sebaceous cyst left shoulder blade 04/16/2016  . Vaginal discharge 06/22/2015  . Hypertension 11/23/2012  . Diabetes ([place]) 11/23/2012  . Elevated cholesterol 11/23/2012   Ihor Austin, [place]; [place]  Aldona Lento 12/02/2016, 9:51 AM  [place], [place],  [place] Phone: [phone]   Fax:  [phone]  Name: Karina Kirk MRN: [id number] Date of Birth: 1955-09-20

## 2016-12-04 ENCOUNTER — Encounter (HOSPITAL_COMMUNITY): Payer: Self-pay | Admitting: Physical Therapy

## 2016-12-04 ENCOUNTER — Ambulatory Visit (HOSPITAL_COMMUNITY): Payer: BLUE CROSS/BLUE SHIELD | Admitting: Physical Therapy

## 2016-12-04 DIAGNOSIS — M25511 Pain in right shoulder: Secondary | ICD-10-CM | POA: Diagnosis not present

## 2016-12-04 DIAGNOSIS — G8929 Other chronic pain: Secondary | ICD-10-CM

## 2016-12-04 DIAGNOSIS — M6281 Muscle weakness (generalized): Secondary | ICD-10-CM

## 2016-12-04 DIAGNOSIS — M25611 Stiffness of right shoulder, not elsewhere classified: Secondary | ICD-10-CM

## 2016-12-04 DIAGNOSIS — R293 Abnormal posture: Secondary | ICD-10-CM

## 2016-12-04 NOTE — Therapy (Signed)
[place], [place], [address] Phone: [phone]   Fax:  [phone]  Physical Therapy Treatment  Patient Details  Name: Karina Kirk MRN: [id number] Date of Birth: May 14, 1955 Referring Provider: Sydnee Cabal   Encounter Date: 12/04/2016      PT End of Session - 12/04/16 0944    Visit Number 5   Number of Visits 17   Date for PT Re-Evaluation 12/18/16   Authorization Type BCBS Other    Authorization Time Period 11/20/16 to 01/20/17   PT Start Time 0903   PT Stop Time 0941   PT Time Calculation (min) 38 min   Activity Tolerance Patient tolerated treatment well;No increased pain   Behavior During Therapy WFL for tasks assessed/performed      Past Medical History:  Diagnosis Date  . Diabetes mellitus without complication ([place])   . Elevated cholesterol   . Hypertension   . Sebaceous cyst   . Vaginal discharge 06/22/2015    Past Surgical History:  Procedure Laterality Date  . ABDOMINAL HYSTERECTOMY      There were no vitals filed for this visit.      Subjective Assessment - 12/04/16 0904    Subjective patient arrives stating that she is doing well today, no major changes, HEP is going well    Pertinent History DM, HTN    Patient Stated Goals get out of the sling and get back to moving well    Currently in Pain? No/denies                         [facility] Adult PT Treatment/Exercise - 12/04/16 0001      Shoulder Exercises: Seated   Other Seated Exercises isometric R shoulder flfexion/ABD/extension 1x5    Other Seated Exercises bicep curls isolated to elbow 3# 1x15; wrist flexion isolated to wrist 1x15 3#; scap retractions 1x20 3 second holds; chin tucks 1x20 3 second holds        Shoulder Exercises: ROM/Strengthening   Other ROM/Strengthening Exercises R shoulder flexion PROM (no more than 90 degrees elevation) 1x15 with 3 second holds; ABD PROM as tlerated 1x15   Other  ROM/Strengthening Exercises R shoudler ER as tolerated 1x15 PROM at approx 40 degrees ABD      Manual Therapy   Manual Therapy Soft tissue mobilization   Manual therapy comments seperate from rest of session    Myofascial Release Seated to R shoulder to reduce knots/adhesions                 PT Education - 12/04/16 0944    Education provided Yes   Education Details correct sling wear, limitations of MD protocol right now    Person(s) Educated Patient   Methods Explanation   Comprehension Verbalized understanding          PT Short Term Goals - 11/20/16 1203      PT SHORT TERM GOAL #1   Title Patient to be compliant with correct performance of HEP, to be updated as protocol allows    Time 1   Period Weeks   Status New   Target Date 11/27/16     PT SHORT TERM GOAL #2   Title Patient to be able to maintain correct posture at least 75% of the time without external cues in order to maintain correct mechanics and prevent pain exacerbation    Time 4   Period Weeks   Status New  Target Date 12/18/16     PT SHORT TERM GOAL #3   Title Patient to demonstate PROM as being full on all planes (as allowed per protocol) with no pain increase in order to show improvement of condition    Time 4   Period Weeks   Status New           PT Long Term Goals - 11/20/16 1205      PT LONG TERM GOAL #1   Title Patient to demonstrate AAROM as being full in all planes in order to show improvement of condition and good mobilty of shoulder complex    Time 8   Period Weeks   Status New   Target Date 01/15/17     PT LONG TERM GOAL #2   Title Patient to be able to perform AROM to at least 80% of full on all planes without major compensations in order to show improvement of condition and improving functional task tolerance    Time 8   Period Weeks   Status New     PT LONG TERM GOAL #3   Title Patient to be able to perform all selfcare and dressing activities with no increase in pain  in order to improve functional task performance    Time 8   Period Weeks   Status New     PT LONG TERM GOAL #4   Title Patient to demonstrate MMT as being at least 4/5 in all tested R shoulder muscles in order to improve functional task performance    Time 8   Period Weeks   Status New               Plan - 12/04/16 0944    Clinical Impression Statement Patient arrives reporting she is doing well, no major changes right now. Continued with PROM as able per protocol and tolerated, also STM to reduce soft tissue limitations and adhesions as well as isometric and postural exercises allowed by protocol. Discussed correct wear/use of sling and reviewed current limitations per protocol    Rehab Potential Good   Clinical Impairments Affecting Rehab Potential (+) motivated to participate, moderate outcomes with PT in the past   PT Frequency 2x / week   PT Duration 8 weeks   PT Treatment/Interventions ADLs/Self Care Home Management;Biofeedback;Cryotherapy;Electrical Stimulation;Iontophoresis 4mg /ml Dexamethasone;Moist Heat;Ultrasound;Gait training;DME Instruction;Stair training;Functional mobility training;Therapeutic activities;Therapeutic exercise;Balance training;Neuromuscular re-education;Patient/family education;Manual techniques;Scar mobilization;Passive range of motion;Dry needling   PT Next Visit Plan Surgery done 8/23, follow [place] [facility] protocol: pendulums, supine PROM with forward elevation to no more than 90 degrees, other PROM to tolerance, isometrics for cuff    PT Home Exercise Plan Eval: scapular retractions, chin tucks, 3D cervical excursions, biceps AROM, wrist AROM, grip strength squeezes    Consulted and Agree with Plan of Care Patient      Patient will benefit from skilled therapeutic intervention in order to improve the following deficits and impairments:  Decreased skin integrity, Improper body mechanics, Decreased coordination, Decreased mobility,  Decreased scar mobility, Postural dysfunction, Decreased strength, Decreased range of motion, Hypomobility, Impaired UE functional use, Impaired flexibility  Visit Diagnosis: Chronic right shoulder pain  Abnormal posture  Muscle weakness (generalized)  Stiffness of right shoulder, not elsewhere classified     Problem List Patient Active Problem List   Diagnosis Date Noted  . Inflamed sebaceous cyst left shoulder blade 04/16/2016  . Vaginal discharge 06/22/2015  . Hypertension 11/23/2012  . Diabetes ([place]) 11/23/2012  . Elevated cholesterol 11/23/2012  Deniece Ree PT, DPT [phone]  [place] [address] [place], [place], [address] Phone: [phone]   Fax:  [phone]  Name: LYLE LEISNER MRN: [id number] Date of Birth: 1955-12-22

## 2016-12-09 ENCOUNTER — Encounter (HOSPITAL_COMMUNITY): Payer: Self-pay | Admitting: Physical Therapy

## 2016-12-09 ENCOUNTER — Telehealth: Payer: Self-pay | Admitting: *Deleted

## 2016-12-09 ENCOUNTER — Ambulatory Visit (HOSPITAL_COMMUNITY): Payer: BLUE CROSS/BLUE SHIELD | Admitting: Physical Therapy

## 2016-12-09 DIAGNOSIS — M25511 Pain in right shoulder: Principal | ICD-10-CM

## 2016-12-09 DIAGNOSIS — M6281 Muscle weakness (generalized): Secondary | ICD-10-CM

## 2016-12-09 DIAGNOSIS — G8929 Other chronic pain: Secondary | ICD-10-CM

## 2016-12-09 DIAGNOSIS — M25611 Stiffness of right shoulder, not elsewhere classified: Secondary | ICD-10-CM

## 2016-12-09 DIAGNOSIS — R293 Abnormal posture: Secondary | ICD-10-CM

## 2016-12-09 NOTE — Telephone Encounter (Signed)
Left message advising pt to call and schedule an appt if she is having issues with a boil. Pt was last seen in Jan. 2018. [place]

## 2016-12-09 NOTE — Therapy (Signed)
[place], [place], [address] Phone: [phone]   Fax:  [phone]  Physical Therapy Treatment  Patient Details  Name: Karina Kirk MRN: [id number] Date of Birth: 1956/02/13 Referring Provider: Sydnee Cabal   Encounter Date: 12/09/2016      PT End of Session - 12/09/16 0938    Visit Number 6   Number of Visits 17   Date for PT Re-Evaluation 12/18/16   Authorization Type BCBS Other    Authorization Time Period 11/20/16 to 01/20/17   PT Start Time 0902   PT Stop Time 0943   PT Time Calculation (min) 41 min   Activity Tolerance Patient tolerated treatment well;No increased pain   Behavior During Therapy WFL for tasks assessed/performed      Past Medical History:  Diagnosis Date  . Diabetes mellitus without complication ([place])   . Elevated cholesterol   . Hypertension   . Sebaceous cyst   . Vaginal discharge 06/22/2015    Past Surgical History:  Procedure Laterality Date  . ABDOMINAL HYSTERECTOMY      There were no vitals filed for this visit.      Subjective Assessment - 12/09/16 0903    Subjective Pt arrives in her sling. She states that she doesn't have any pain today and denies over the weekend as well. Notes she is still doing her HEP.   Pertinent History DM, HTN    Patient Stated Goals get out of the sling and get back to moving well    Currently in Pain? No/denies                         [facility] Adult PT Treatment/Exercise - 12/09/16 0001      Shoulder Exercises: Supine   External Rotation PROM;Right;10 reps   Theraband Level (Shoulder External Rotation) Other (comment)  wand     Shoulder Exercises: Seated   Other Seated Exercises isometric R shoulder fexion/ABD/extension 1x8    Other Seated Exercises bicep curls isolated to elbow 3# 1x15; wrist flexion isolated to wrist 1x15 3#; scap retractions 1x20 3 second holds; chin tucks 1x20 3 second holds        Shoulder  Exercises: Isometric Strengthening   Flexion Limitations 10 x 3" hold manual pressure   Extension Limitations 10 x 3" hold manual ressitance   External Rotation Limitations 10 x 3" hold manual ressitance   ABduction Limitations 10 x 3" hold manual ressitance   ADduction Limitations 10 x 3" hold manual ressitance     Manual Therapy   Manual therapy comments seperate from rest of session    Passive ROM Rt shoulder flexion, abd, IR/ER per pt tolerance                  PT Short Term Goals - 11/20/16 1203      PT SHORT TERM GOAL #1   Title Patient to be compliant with correct performance of HEP, to be updated as protocol allows    Time 1   Period Weeks   Status New   Target Date 11/27/16     PT SHORT TERM GOAL #2   Title Patient to be able to maintain correct posture at least 75% of the time without external cues in order to maintain correct mechanics and prevent pain exacerbation    Time 4   Period Weeks   Status New   Target Date 12/18/16     PT SHORT  TERM GOAL #3   Title Patient to demonstate PROM as being full on all planes (as allowed per protocol) with no pain increase in order to show improvement of condition    Time 4   Period Weeks   Status New           PT Long Term Goals - 11/20/16 1205      PT LONG TERM GOAL #1   Title Patient to demonstrate AAROM as being full in all planes in order to show improvement of condition and good mobilty of shoulder complex    Time 8   Period Weeks   Status New   Target Date 01/15/17     PT LONG TERM GOAL #2   Title Patient to be able to perform AROM to at least 80% of full on all planes without major compensations in order to show improvement of condition and improving functional task tolerance    Time 8   Period Weeks   Status New     PT LONG TERM GOAL #3   Title Patient to be able to perform all selfcare and dressing activities with no increase in pain in order to improve functional task performance    Time 8    Period Weeks   Status New     PT LONG TERM GOAL #4   Title Patient to demonstrate MMT as being at least 4/5 in all tested R shoulder muscles in order to improve functional task performance    Time [place] - 12/09/16 0943    Clinical Impression Statement Patient has contniued reports of doing well with HEP and pain management, she continues to have reports of no changes. PT focused on continued compliance with protocol with PROM and light isometrics. Pt has slight increased tension of anterior shoulder muscles noted with STM. Pt encouraged to continue postural control exercises at home to decreased rounded shoulder presentation.    History and Personal Factors relevant to plan of care: Rotator cuff repair 11/13/16 - 4 weeks as of 12/11/16   Clinical Presentation Stable   Clinical Decision Making Low   Rehab Potential Good   Clinical Impairments Affecting Rehab Potential (+) motivated to participate, moderate outcomes with PT in the past   PT Frequency 2x / week   PT Duration 8 weeks   PT Treatment/Interventions ADLs/Self Care Home Management;Biofeedback;Cryotherapy;Electrical Stimulation;Iontophoresis 4mg /ml Dexamethasone;Moist Heat;Ultrasound;Gait training;DME Instruction;Stair training;Functional mobility training;Therapeutic activities;Therapeutic exercise;Balance training;Neuromuscular re-education;Patient/family education;Manual techniques;Scar mobilization;Passive range of motion;Dry needling   PT Next Visit Plan Progress per protocol as needed - currently in week 4 started 12/11/16; follow up with MD 9/24; continue PROM within protocol guidance   PT Home Exercise Plan Eval: scapular retractions, chin tucks, 3D cervical excursions, biceps AROM, wrist AROM, grip strength squeezes    Consulted and Agree with Plan of Care Patient      Patient will benefit from skilled therapeutic intervention in order to improve the following deficits and  impairments:  Decreased skin integrity, Improper body mechanics, Decreased coordination, Decreased mobility, Decreased scar mobility, Postural dysfunction, Decreased strength, Decreased range of motion, Hypomobility, Impaired UE functional use, Impaired flexibility  Visit Diagnosis: Chronic right shoulder pain  Abnormal posture  Muscle weakness (generalized)  Stiffness of right shoulder, not elsewhere classified     Problem List Patient Active Problem List   Diagnosis Date Noted  . Inflamed sebaceous cyst left  shoulder blade 04/16/2016  . Vaginal discharge 06/22/2015  . Hypertension 11/23/2012  . Diabetes ([place]) 11/23/2012  . Elevated cholesterol 11/23/2012    Starr [place], PT, DPT 12/09/2016, 9:49 AM  [place] [facility] [address] [place], [place], [address] Phone: [phone]   Fax:  [phone]  Name: DALIANA LEVERETT MRN: [id number] Date of Birth: 1955/06/21

## 2016-12-11 ENCOUNTER — Encounter (HOSPITAL_COMMUNITY): Payer: Self-pay | Admitting: Physical Therapy

## 2016-12-11 ENCOUNTER — Ambulatory Visit (HOSPITAL_COMMUNITY): Payer: BLUE CROSS/BLUE SHIELD | Admitting: Physical Therapy

## 2016-12-11 DIAGNOSIS — M6281 Muscle weakness (generalized): Secondary | ICD-10-CM

## 2016-12-11 DIAGNOSIS — G8929 Other chronic pain: Secondary | ICD-10-CM

## 2016-12-11 DIAGNOSIS — M25611 Stiffness of right shoulder, not elsewhere classified: Secondary | ICD-10-CM

## 2016-12-11 DIAGNOSIS — R293 Abnormal posture: Secondary | ICD-10-CM

## 2016-12-11 DIAGNOSIS — M25511 Pain in right shoulder: Secondary | ICD-10-CM | POA: Diagnosis not present

## 2016-12-11 NOTE — Therapy (Signed)
[place], [place], [address] Phone: [phone]   Fax:  [phone]  Physical Therapy Treatment  Patient Details  Name: Karina Kirk MRN: [id number] Date of Birth: [birth year] Referring Provider: Sydnee Cabal   Encounter Date: 12/11/2016      PT End of Session - 12/11/16 0941    Visit Number 7   Number of Visits 17   Date for PT Re-Evaluation 12/18/16   Authorization Type BCBS Other    Authorization Time Period 11/20/16 to 01/20/17   PT Start Time 0902   PT Stop Time 0940   PT Time Calculation (min) 38 min   Activity Tolerance Patient tolerated treatment well   Behavior During Therapy [facility] for tasks assessed/performed      Past Medical History:  Diagnosis Date  . Diabetes mellitus without complication ([place])   . Elevated cholesterol   . Hypertension   . Sebaceous cyst   . Vaginal discharge 06/22/2015    Past Surgical History:  Procedure Laterality Date  . ABDOMINAL HYSTERECTOMY      There were no vitals filed for this visit.      Subjective Assessment - 12/11/16 0904    Subjective patient arrives today stating she is getting tired of being in the sling, she is seeing her MD Monday and hopes he will tell her she can come out of it soon; she felt good after last session    Pertinent History DM, HTN    Patient Stated Goals get out of the sling and get back to moving well    Currently in Pain? No/denies                         [facility] Adult PT Treatment/Exercise - 12/11/16 0001      Shoulder Exercises: Supine   External Rotation PROM;Right;15 reps   Theraband Level (Shoulder External Rotation) --  approx 30-40 degrees ABD    Flexion PROM;15 reps;Right   Flexion Limitations no more than 90 degrees elevation    ABduction PROM;15 reps;Right   ABduction Limitations PROM as tolerated today      Shoulder Exercises: Seated   Retraction Strengthening;Both;20 reps  3 second holds,    Other Seated Exercises upper trap and levator scap stretches 2x30 seconds bilaterally    Other Seated Exercises shoulder rolls "up, back, down" 1x20; bicep curls 1x15 4# isolated to elbow; washclot grip squeezes/hand extensions 1x15 with 3 second holds                  PT Education - 12/11/16 0940    Education provided Yes   Education Details education regading current place in MD protocol/limitations and upcoming progressions; encouraged her to speak to MD about sling and her concerns about potential return to work in October    Person(s) Educated Patient   Methods Explanation   Comprehension Verbalized understanding          PT Short Term Goals - 11/20/16 1203      PT SHORT TERM GOAL #1   Title Patient to be compliant with correct performance of HEP, to be updated as protocol allows    Time 1   Period Weeks   Status New   Target Date 11/27/16     PT SHORT TERM GOAL #2   Title Patient to be able to maintain correct posture at least 75% of the time without external cues in order to maintain correct mechanics and  prevent pain exacerbation    Time 4   Period Weeks   Status New   Target Date 12/18/16     PT SHORT TERM GOAL #3   Title Patient to demonstate PROM as being full on all planes (as allowed per protocol) with no pain increase in order to show improvement of condition    Time 4   Period Weeks   Status New           PT Long Term Goals - 11/20/16 1205      PT LONG TERM GOAL #1   Title Patient to demonstrate AAROM as being full in all planes in order to show improvement of condition and good mobilty of shoulder complex    Time 8   Period Weeks   Status New   Target Date 01/15/17     PT LONG TERM GOAL #2   Title Patient to be able to perform AROM to at least 80% of full on all planes without major compensations in order to show improvement of condition and improving functional task tolerance    Time 8   Period Weeks   Status New     PT LONG TERM GOAL  #3   Title Patient to be able to perform all selfcare and dressing activities with no increase in pain in order to improve functional task performance    Time 8   Period Weeks   Status New     PT LONG TERM GOAL #4   Title Patient to demonstrate MMT as being at least 4/5 in all tested R shoulder muscles in order to improve functional task performance    Time [place] - 12/11/16 0941    Clinical Impression Statement Continued working on PROM as indicated in protocol, as well as cervical Rom and stretching as well as scapular stabilization and isometric strengthening of R UE. Patient reports she is going to her MD on Monday and is hopeful he will remove the sling, also expresses some concerns regarding being able to return to work- encouraged her to speak to MD regarding these concerns. She will be 5 weeks out from surgery next week (roughly 9/27) and will be able to progress in [place] protocol. Patient requires cues for correct performance of scapular stabilizer activation due to tendency for upper trap dominance/recruitment pattern.    Rehab Potential Good   Clinical Impairments Affecting Rehab Potential (+) motivated to participate, moderate outcomes with PT in the past   PT Frequency 2x / week   PT Duration 8 weeks   PT Treatment/Interventions ADLs/Self Care Home Management;Biofeedback;Cryotherapy;Electrical Stimulation;Iontophoresis 4mg /ml Dexamethasone;Moist Heat;Ultrasound;Gait training;DME Instruction;Stair training;Functional mobility training;Therapeutic activities;Therapeutic exercise;Balance training;Neuromuscular re-education;Patient/family education;Manual techniques;Scar mobilization;Passive range of motion;Dry needling   PT Next Visit Plan Progress per protocol as needed - currently in week 4 started 12/11/16; follow up with MD 9/24; continue PROM within protocol guidance   PT Home Exercise Plan Eval: scapular retractions, chin  tucks, 3D cervical excursions, biceps AROM, wrist AROM, grip strength squeezes    Consulted and Agree with Plan of Care Patient      Patient will benefit from skilled therapeutic intervention in order to improve the following deficits and impairments:  Decreased skin integrity, Improper body mechanics, Decreased coordination, Decreased mobility, Decreased scar mobility, Postural dysfunction, Decreased strength, Decreased range of motion, Hypomobility, Impaired UE functional use, Impaired flexibility  Visit  Diagnosis: Chronic right shoulder pain  Abnormal posture  Muscle weakness (generalized)  Stiffness of right shoulder, not elsewhere classified     Problem List Patient Active Problem List   Diagnosis Date Noted  . Inflamed sebaceous cyst left shoulder blade 04/16/2016  . Vaginal discharge 06/22/2015  . Hypertension 11/23/2012  . Diabetes ([place]) 11/23/2012  . Elevated cholesterol 11/23/2012    Deniece Ree PT, DPT [facility] [address] [place], [place], [address] Phone: [phone]   Fax:  [phone]  Name: Karina Kirk MRN: [id number] Date of Birth: Oct 25, 1955

## 2016-12-16 ENCOUNTER — Ambulatory Visit (HOSPITAL_COMMUNITY): Payer: BLUE CROSS/BLUE SHIELD | Admitting: Physical Therapy

## 2016-12-16 ENCOUNTER — Encounter (HOSPITAL_COMMUNITY): Payer: Self-pay | Admitting: Physical Therapy

## 2016-12-16 DIAGNOSIS — M6281 Muscle weakness (generalized): Secondary | ICD-10-CM

## 2016-12-16 DIAGNOSIS — M25511 Pain in right shoulder: Principal | ICD-10-CM

## 2016-12-16 DIAGNOSIS — R293 Abnormal posture: Secondary | ICD-10-CM

## 2016-12-16 DIAGNOSIS — G8929 Other chronic pain: Secondary | ICD-10-CM

## 2016-12-16 DIAGNOSIS — M25611 Stiffness of right shoulder, not elsewhere classified: Secondary | ICD-10-CM

## 2016-12-16 NOTE — Therapy (Signed)
[facility] [address] [place], [place], [address] Phone: [phone]   Fax:  [phone]  Physical Therapy Treatment (Re-Assessment)  Patient Details  Name: Karina Kirk MRN: [id number] Date of Birth: May 17, 1955 Referring Provider: Sydnee Cabal   Encounter Date: 12/16/2016      PT End of Session - 12/16/16 0942    Visit Number 8   Number of Visits 17   Date for PT Re-Evaluation 01/13/17   Authorization Type BCBS Other    Authorization Time Period 11/20/16 to 01/20/17   PT Start Time 0902   PT Stop Time 0941   PT Time Calculation (min) 39 min   Activity Tolerance Patient tolerated treatment well   Behavior During Therapy [facility] for tasks assessed/performed      Past Medical History:  Diagnosis Date  . Diabetes mellitus without complication ([place])   . Elevated cholesterol   . Hypertension   . Sebaceous cyst   . Vaginal discharge 06/22/2015    Past Surgical History:  Procedure Laterality Date  . ABDOMINAL HYSTERECTOMY      There were no vitals filed for this visit.      Subjective Assessment - 12/16/16 0904    Subjective patient saw her MD on Monday and she says he is happy with her progress, she only has to be in the sling for 2 more weeks and then she can come out of it at that point.    Pertinent History DM, HTN    Patient Stated Goals get out of the sling and get back to moving well    Currently in Pain? No/denies            [facility] PT Assessment - 12/16/16 0001      Posture/Postural Control   Posture/Postural Control Postural limitations   Postural Limitations Rounded Shoulders;Forward head     PROM   Right Shoulder Flexion 110 Degrees  PROM    Right Shoulder ABduction 107 Degrees  PROM    Right Shoulder Internal Rotation --  full, able to place hand on stomach    Right Shoulder External Rotation 35 Degrees  40 degrees ABD; PROM      Strength   Right Elbow Flexion 5/5   Right Elbow Extension 4+/5   Left Elbow Flexion 5/5   Left Elbow Extension 5/5   Right Wrist Flexion 5/5   Right Wrist Extension 5/5   Left Wrist Flexion 5/5   Left Wrist Extension 5/5                     [facility] Adult PT Treatment/Exercise - 12/16/16 0001      Shoulder Exercises: Supine   Flexion PROM;10 reps   Flexion Limitations to tolerace per week 5 of protocol    ABduction PROM;10 reps   ABduction Limitations PROM as tolerated per protocol      Shoulder Exercises: Seated   Other Seated Exercises seated R UE PROM elevation with pulleys, cues to maintain PROM       Shoulder flexion and ABD isometrics x10 R           PT Education - 12/16/16 0941    Education provided Yes   Education Details progress with skilled PT services, POC moving forward; more supportive sling wear (in front of stomach ratehr than to the side)   Person(s) Educated Patient   Methods Explanation   Comprehension Verbalized understanding          PT Short Term  Goals - 12/16/16 0915      PT SHORT TERM GOAL #1   Title Patient to be compliant with correct performance of HEP, to be updated as protocol allows    Baseline 9/25- compliant    Time 1   Period Weeks   Status Achieved     PT SHORT TERM GOAL #2   Title Patient to be able to maintain correct posture at least 75% of the time without external cues in order to maintain correct mechanics and prevent pain exacerbation    Baseline 9/25- improving, continues to require cues however    Time 4   Period Weeks   Status On-going     PT SHORT TERM GOAL #3   Title Patient to demonstate PROM as being full on all planes (as allowed per protocol) with no pain increase in order to show improvement of condition    Baseline 9/25- improving, limited by pace of protocol    Time 4   Period Weeks   Status On-going           PT Long Term Goals - 12/16/16 0917      PT LONG TERM GOAL #1   Title Patient to demonstrate AAROM as being full in all planes in order to  show improvement of condition and good mobilty of shoulder complex    Baseline 9/25- ongoing    Time 8   Status On-going     PT LONG TERM GOAL #2   Title Patient to be able to perform AROM to at least 80% of full on all planes without major compensations in order to show improvement of condition and improving functional task tolerance    Time 8   Period Weeks   Status On-going     PT LONG TERM GOAL #3   Title Patient to be able to perform all selfcare and dressing activities with no increase in pain in order to improve functional task performance    Baseline 9/25- ongoing    Time 8   Period Weeks   Status On-going     PT LONG TERM GOAL #4   Title Patient to demonstrate MMT as being at least 4/5 in all tested R shoulder muscles in order to improve functional task performance    Baseline 9/25- ongoing    Time 8   Period Weeks   Status On-going               Plan - 12/16/16 0943    Clinical Impression Statement Re-assessment performed today. Patient appears to be making objective improvements, the pace of which however has been limited per restrictions of protocol; that being said, patient appears to have progressed appropriately given protocol restrictions at this time. Progressed patient to week five of [place] protocol this session with good tolerance however cues were needed for PROM and general form overall. Recommend continuation of skilled PT services for RTC repair rehab as well as appropriate progression along protocol for return to full function moving forward.    Rehab Potential Good   Clinical Impairments Affecting Rehab Potential (+) motivated to participate, moderate outcomes with PT in the past   PT Frequency 2x / week   PT Duration 4 weeks   PT Treatment/Interventions ADLs/Self Care Home Management;Biofeedback;Cryotherapy;Electrical Stimulation;Iontophoresis 4mg /ml Dexamethasone;Moist Heat;Ultrasound;Gait training;DME Instruction;Stair  training;Functional mobility training;Therapeutic activities;Therapeutic exercise;Balance training;Neuromuscular re-education;Patient/family education;Manual techniques;Scar mobilization;Passive range of motion;Dry needling   PT Next Visit Plan progress per protocol- week 5 on 9/25. PROM as tolerated. AAROM  when she has full painfree PROM motion. Update HEP    PT Home Exercise Plan Eval: scapular retractions, chin tucks, 3D cervical excursions, biceps AROM, wrist AROM, grip strength squeezes    Consulted and Agree with Plan of Care Patient      Patient will benefit from skilled therapeutic intervention in order to improve the following deficits and impairments:  Decreased skin integrity, Improper body mechanics, Decreased coordination, Decreased mobility, Decreased scar mobility, Postural dysfunction, Decreased strength, Decreased range of motion, Hypomobility, Impaired UE functional use, Impaired flexibility  Visit Diagnosis: Chronic right shoulder pain  Abnormal posture  Muscle weakness (generalized)  Stiffness of right shoulder, not elsewhere classified     Problem List Patient Active Problem List   Diagnosis Date Noted  . Inflamed sebaceous cyst left shoulder blade 04/16/2016  . Vaginal discharge 06/22/2015  . Hypertension 11/23/2012  . Diabetes ([place]) 11/23/2012  . Elevated cholesterol 11/23/2012    Deniece Ree PT, DPT [facility] [address] [place], [place], [address] Phone: [phone]   Fax:  [phone]  Name: Karina Kirk MRN: [id number] Date of Birth: Nov 02, 1955

## 2016-12-18 ENCOUNTER — Ambulatory Visit (HOSPITAL_COMMUNITY): Payer: BLUE CROSS/BLUE SHIELD

## 2016-12-18 ENCOUNTER — Encounter (HOSPITAL_COMMUNITY): Payer: Self-pay

## 2016-12-18 DIAGNOSIS — M25511 Pain in right shoulder: Principal | ICD-10-CM

## 2016-12-18 DIAGNOSIS — M25611 Stiffness of right shoulder, not elsewhere classified: Secondary | ICD-10-CM

## 2016-12-18 DIAGNOSIS — M6281 Muscle weakness (generalized): Secondary | ICD-10-CM

## 2016-12-18 DIAGNOSIS — R293 Abnormal posture: Secondary | ICD-10-CM

## 2016-12-18 DIAGNOSIS — G8929 Other chronic pain: Secondary | ICD-10-CM

## 2016-12-18 NOTE — Therapy (Signed)
[facility] [address] [place], [place], [address] Phone: [phone]   Fax:  [phone]  Physical Therapy Treatment  Patient Details  Name: Karina Kirk MRN: [id number] Date of Birth: 1955-10-04 Referring Provider: Sydnee Cabal   Encounter Date: 12/18/2016      PT End of Session - 12/18/16 0955    Visit Number 9   Number of Visits 17   Date for PT Re-Evaluation 01/13/17   Authorization Type BCBS Other    Authorization Time Period 11/20/16 to 01/20/17   PT Start Time 0950   PT Stop Time 1028   PT Time Calculation (min) 38 min   Activity Tolerance Patient tolerated treatment well   Behavior During Therapy [facility] for tasks assessed/performed      Past Medical History:  Diagnosis Date  . Diabetes mellitus without complication ([place])   . Elevated cholesterol   . Hypertension   . Sebaceous cyst   . Vaginal discharge 06/22/2015    Past Surgical History:  Procedure Laterality Date  . ABDOMINAL HYSTERECTOMY      There were no vitals filed for this visit.      Subjective Assessment - 12/18/16 0955    Subjective Pt arrived with sling on Rt shoulder, reports her shoulder is feeling good today, no reports of pain.     Patient Stated Goals get out of the sling and get back to moving well    Currently in Pain? No/denies                         [facility] Adult PT Treatment/Exercise - 12/18/16 0001      Posture/Postural Control   Posture/Postural Control Postural limitations   Postural Limitations Rounded Shoulders;Forward head     Shoulder Exercises: Supine   External Rotation PROM;Right;15 reps   Flexion PROM;10 reps   ABduction PROM;10 reps   ABduction Limitations tactile cueing to reduce substituion with shoulder elevation; PROM 115 degrees      Shoulder Exercises: Seated   Row 20 reps   Row Limitations no resistance   Other Seated Exercises shoulder rolls "up, back, down" 1x20; bicep and brachioradialis  curls 1x20 4# isolated to elbow; grip strength squeeze apple 20x 5"     Manual Therapy   Manual Therapy Passive ROM   Passive ROM Rt shoulder flexion, abd, IR/ER per pt tolerance                  PT Short Term Goals - 12/16/16 0915      PT SHORT TERM GOAL #1   Title Patient to be compliant with correct performance of HEP, to be updated as protocol allows    Baseline 9/25- compliant    Time 1   Period Weeks   Status Achieved     PT SHORT TERM GOAL #2   Title Patient to be able to maintain correct posture at least 75% of the time without external cues in order to maintain correct mechanics and prevent pain exacerbation    Baseline 9/25- improving, continues to require cues however    Time 4   Period Weeks   Status On-going     PT SHORT TERM GOAL #3   Title Patient to demonstate PROM as being full on all planes (as allowed per protocol) with no pain increase in order to show improvement of condition    Baseline 9/25- improving, limited by pace of protocol    Time 4  Period Weeks   Status On-going           PT Long Term Goals - 12/16/16 0917      PT LONG TERM GOAL #1   Title Patient to demonstrate AAROM as being full in all planes in order to show improvement of condition and good mobilty of shoulder complex    Baseline 9/25- ongoing    Time 8   Status On-going     PT LONG TERM GOAL #2   Title Patient to be able to perform AROM to at least 80% of full on all planes without major compensations in order to show improvement of condition and improving functional task tolerance    Time 8   Period Weeks   Status On-going     PT LONG TERM GOAL #3   Title Patient to be able to perform all selfcare and dressing activities with no increase in pain in order to improve functional task performance    Baseline 9/25- ongoing    Time 8   Period Weeks   Status On-going     PT LONG TERM GOAL #4   Title Patient to demonstrate MMT as being at least 4/5 in all tested R  shoulder muscles in order to improve functional task performance    Baseline 9/25- ongoing    Time 8   Period Weeks   Status On-going               Plan - 12/18/16 1726    Clinical Impression Statement Continued session focus with shoulder mobility.  Pt tolerated well wtih PROM with improved ROM all directions.  No reports of pain through session.  Did require cueing to reduce compensation with posture strenghtening therex and shoulder elevation wiht PROM abduction.     Rehab Potential Good   Clinical Impairments Affecting Rehab Potential (+) motivated to participate, moderate outcomes with PT in the past   PT Frequency 2x / week   PT Duration 4 weeks   PT Treatment/Interventions ADLs/Self Care Home Management;Biofeedback;Cryotherapy;Electrical Stimulation;Iontophoresis 4mg /ml Dexamethasone;Moist Heat;Ultrasound;Gait training;DME Instruction;Stair training;Functional mobility training;Therapeutic activities;Therapeutic exercise;Balance training;Neuromuscular re-education;Patient/family education;Manual techniques;Scar mobilization;Passive range of motion;Dry needling   PT Next Visit Plan progress per protocol- week 5 on 9/25. PROM as tolerated. AAROM when she has full painfree PROM motion. Update HEP    PT Home Exercise Plan Eval: scapular retractions, chin tucks, 3D cervical excursions, biceps AROM, wrist AROM, grip strength squeezes       Patient will benefit from skilled therapeutic intervention in order to improve the following deficits and impairments:  Decreased skin integrity, Improper body mechanics, Decreased coordination, Decreased mobility, Decreased scar mobility, Postural dysfunction, Decreased strength, Decreased range of motion, Hypomobility, Impaired UE functional use, Impaired flexibility  Visit Diagnosis: Chronic right shoulder pain  Abnormal posture  Muscle weakness (generalized)  Stiffness of right shoulder, not elsewhere classified     Problem  List Patient Active Problem List   Diagnosis Date Noted  . Inflamed sebaceous cyst left shoulder blade 04/16/2016  . Vaginal discharge 06/22/2015  . Hypertension 11/23/2012  . Diabetes ([place]) 11/23/2012  . Elevated cholesterol 11/23/2012   Ihor Austin, LPTA; [place]  Aldona Lento 12/18/2016, 5:30 PM  [facility] [address] [place], [place], [address] Phone: [phone]   Fax:  [phone]  Name: EMERALD SHOR MRN: [id number] Date of Birth: Dec 10, 1955

## 2016-12-23 ENCOUNTER — Encounter (HOSPITAL_COMMUNITY): Payer: Self-pay | Admitting: Physical Therapy

## 2016-12-23 ENCOUNTER — Ambulatory Visit (HOSPITAL_COMMUNITY): Payer: BLUE CROSS/BLUE SHIELD | Attending: Specialist | Admitting: Physical Therapy

## 2016-12-23 DIAGNOSIS — M25511 Pain in right shoulder: Secondary | ICD-10-CM | POA: Insufficient documentation

## 2016-12-23 DIAGNOSIS — M25611 Stiffness of right shoulder, not elsewhere classified: Secondary | ICD-10-CM | POA: Insufficient documentation

## 2016-12-23 DIAGNOSIS — M6281 Muscle weakness (generalized): Secondary | ICD-10-CM | POA: Diagnosis present

## 2016-12-23 DIAGNOSIS — G8929 Other chronic pain: Secondary | ICD-10-CM | POA: Insufficient documentation

## 2016-12-23 DIAGNOSIS — R293 Abnormal posture: Secondary | ICD-10-CM | POA: Insufficient documentation

## 2016-12-23 NOTE — Therapy (Signed)
[facility] [address] [place], [place], [address] Phone: [phone]   Fax:  [phone]  Physical Therapy Treatment  Patient Details  Name: Karina Kirk MRN: [id number] Date of Birth: [birth year] Referring Provider: Sydnee Cabal   Encounter Date: 12/23/2016      PT End of Session - 12/23/16 0935    Visit Number 10   Number of Visits 17   Date for PT Re-Evaluation 01/13/17   Authorization Type BCBS Other    Authorization Time Period 11/20/16 to 01/20/17   PT Start Time 0855   PT Stop Time 0935   PT Time Calculation (min) 40 min   Activity Tolerance Patient tolerated treatment well   Behavior During Therapy [facility] for tasks assessed/performed      Past Medical History:  Diagnosis Date  . Diabetes mellitus without complication ([place])   . Elevated cholesterol   . Hypertension   . Sebaceous cyst   . Vaginal discharge 06/22/2015    Past Surgical History:  Procedure Laterality Date  . ABDOMINAL HYSTERECTOMY      There were no vitals filed for this visit.      Subjective Assessment - 12/23/16 0856    Subjective Patient arrives today without her sling, she states she was told she could come out of it at week 6 by her MD. No major changes otherwise.    Pertinent History DM, HTN    Patient Stated Goals get out of the sling and get back to moving well    Currently in Pain? No/denies                         [facility] Adult PT Treatment/Exercise - 12/23/16 0001      Shoulder Exercises: Supine   External Rotation PROM;Right;15 reps;Other (comment)  3 second holds; approx 60 degrees at 70 degrees ABD    Flexion PROM;15 reps;Right  3 second holds    Flexion Limitations to tolerate approx 140-150 degrees    ABduction PROM;15 reps;Other (comment);Right  3 second holds    ABduction Limitations scapular plane, to tolerance, approximately 150-160 degrees      Shoulder Exercises: Seated   Retraction 20 reps  3 second  holds    Other Seated Exercises passive shoulder flexion and ABD stretches 10x10 seconds each    Other Seated Exercises shoulder rolls 1x20; bicep curls 5# 1x20     Shoulder Exercises: Standing   Other Standing Exercises wall ER passive stretch 10x10 seconds, towel under elbow                 PT Education - 12/23/16 0935    Education provided Yes   Education Details HEP updates, general progression in protocol, POC moving forward    Person(s) Educated Patient   Methods Explanation;Handout   Comprehension Verbalized understanding;Returned demonstration;Need further instruction          PT Short Term Goals - 12/16/16 0915      PT SHORT TERM GOAL #1   Title Patient to be compliant with correct performance of HEP, to be updated as protocol allows    Baseline 9/25- compliant    Time 1   Period Weeks   Status Achieved     PT SHORT TERM GOAL #2   Title Patient to be able to maintain correct posture at least 75% of the time without external cues in order to maintain correct mechanics and prevent pain exacerbation    Baseline 9/25-  improving, continues to require cues however    Time 4   Period Weeks   Status On-going     PT SHORT TERM GOAL #3   Title Patient to demonstate PROM as being full on all planes (as allowed per protocol) with no pain increase in order to show improvement of condition    Baseline 9/25- improving, limited by pace of protocol    Time 4   Period Weeks   Status On-going           PT Long Term Goals - 12/16/16 0917      PT LONG TERM GOAL #1   Title Patient to demonstrate AAROM as being full in all planes in order to show improvement of condition and good mobilty of shoulder complex    Baseline 9/25- ongoing    Time 8   Status On-going     PT LONG TERM GOAL #2   Title Patient to be able to perform AROM to at least 80% of full on all planes without major compensations in order to show improvement of condition and improving functional task  tolerance    Time 8   Period Weeks   Status On-going     PT LONG TERM GOAL #3   Title Patient to be able to perform all selfcare and dressing activities with no increase in pain in order to improve functional task performance    Baseline 9/25- ongoing    Time 8   Period Weeks   Status On-going     PT LONG TERM GOAL #4   Title Patient to demonstrate MMT as being at least 4/5 in all tested R shoulder muscles in order to improve functional task performance    Baseline 9/25- ongoing    Time 8   Period Weeks   Status On-going               Plan - 12/23/16 0936    Clinical Impression Statement Continued with PROM per protocol, noting significant improvements in PROM today (see flowsheets for approximate degrees/angles). Patient is now out of her sling per MD advice as she is at week 6 of her protocol, and reports this seems to have helped her shoulder really feel a lot better. Updated HEP today with passive shoulder stretches to promote ROM and assist in progressing through protocol. Patient doing very well overall and progressing as appropriate per protocol.    Rehab Potential Good   Clinical Impairments Affecting Rehab Potential (+) motivated to participate, moderate outcomes with PT in the past   PT Frequency 2x / week   PT Duration 4 weeks   PT Treatment/Interventions ADLs/Self Care Home Management;Biofeedback;Cryotherapy;Electrical Stimulation;Iontophoresis 4mg /ml Dexamethasone;Moist Heat;Ultrasound;Gait training;DME Instruction;Stair training;Functional mobility training;Therapeutic activities;Therapeutic exercise;Balance training;Neuromuscular re-education;Patient/family education;Manual techniques;Scar mobilization;Passive range of motion;Dry needling   PT Next Visit Plan progress per protocol- week 6 on 10/2. PROM as tolerated, no PROM restictions at this point. AAROM when she has full painfree PROM motion.    PT Home Exercise Plan Eval: scapular retractions, chin tucks, 3D  cervical excursions, biceps AROM, wrist AROM, grip strength squeezes 10/2- passive shoulder flexion, ABD, ER stretches    Consulted and Agree with Plan of Care Patient      Patient will benefit from skilled therapeutic intervention in order to improve the following deficits and impairments:  Decreased skin integrity, Improper body mechanics, Decreased coordination, Decreased mobility, Decreased scar mobility, Postural dysfunction, Decreased strength, Decreased range of motion, Hypomobility, Impaired UE functional use, Impaired flexibility  Visit  Diagnosis: Chronic right shoulder pain  Abnormal posture  Muscle weakness (generalized)  Stiffness of right shoulder, not elsewhere classified     Problem List Patient Active Problem List   Diagnosis Date Noted  . Inflamed sebaceous cyst left shoulder blade 04/16/2016  . Vaginal discharge 06/22/2015  . Hypertension 11/23/2012  . Diabetes ([place]) 11/23/2012  . Elevated cholesterol 11/23/2012    Deniece Ree PT, DPT [facility] [address] [place], [place], [address] Phone: [phone]   Fax:  [phone]  Name: Karina Kirk MRN: [id number] Date of Birth: 1955/11/13

## 2016-12-23 NOTE — Patient Instructions (Signed)
   Seated Passive Shoulder Flexion  Place both arms up on table/desk/counter with elbows straight and thumbs up as pictured. While seated on a chair, bend forward at your hips, allowing your shoulder to passively move into flexion.   Go to a point where you feel a good comfortable stretch, do not stretch to the point of pain.    Hold for 10 seconds; repeat 10-15 times, twice a day.    Seated Passive Shoulder Abduction  Sitting with the table to your right, place your right arm out to your side and rest it on the table as pictured. Your thumb should be facing the ceiling.  Lean into the table to passively abduct your arm; keep going until you feel a stretch- this should not be painful.   Hold for 10 seconds, repeat 10 times, twice a day.     Wall External Rotation  Grasp the wall or doorway with the injured arm. The elbow should be at 90 degrees. Tuck a towel under your right elbow and hold it to your body with that elbow. Gently turn your entire body until a stretch is felt. Your elbow should stay at your side throughout the movement, holding the towel to your side.  Hold for 10 seconds, repeat 10 times, twice a day.

## 2016-12-24 ENCOUNTER — Ambulatory Visit (INDEPENDENT_AMBULATORY_CARE_PROVIDER_SITE_OTHER): Payer: BLUE CROSS/BLUE SHIELD | Admitting: Obstetrics and Gynecology

## 2016-12-24 ENCOUNTER — Encounter: Payer: Self-pay | Admitting: Obstetrics and Gynecology

## 2016-12-24 VITALS — BP 108/68 | HR 77 | Ht 66.0 in | Wt 152.4 lb

## 2016-12-24 DIAGNOSIS — G8929 Other chronic pain: Secondary | ICD-10-CM

## 2016-12-24 DIAGNOSIS — M25512 Pain in left shoulder: Secondary | ICD-10-CM | POA: Diagnosis not present

## 2016-12-24 NOTE — Progress Notes (Signed)
Patient ID: Karina Kirk, female   DOB: 1956-03-19, 61 y.o.   MRN: [id number]    [facility] Visit  @DATE @            Patient name: Karina Kirk MRN [id number]  Date of birth: [birth year]  CC & HPI:  Karina Kirk is a 61 y.o. female presenting today for pain to her left shoulder where a sebaceous cyst was removed in January 2018. No modifying factors noted. Pt denies any other associated symptoms or complaints at this time.  ROS:  ROS (+) left shoulder pain All systems are negative except as noted in the HPI and PMH.   Pertinent History Reviewed:   Reviewed: Significant for sebaceous cyst Medical         Past Medical History:  Diagnosis Date  . Diabetes mellitus without complication ([place])   . Elevated cholesterol   . Hypertension   . Sebaceous cyst   . Vaginal discharge 06/22/2015                              Surgical Hx:    Past Surgical History:  Procedure Laterality Date  . ABDOMINAL HYSTERECTOMY     Medications: Reviewed & Updated - see associated section                       Current Outpatient Prescriptions:  .  amLODipine-valsartan (EXFORGE) 10-320 MG tablet, Take 1 tablet by mouth daily. , Disp: , Rfl: 0 .  ezetimibe-simvastatin (VYTORIN) 10-20 MG tablet, Take 1 tablet by mouth daily., Disp: , Rfl:  .  glipiZIDE (GLUCOTROL XL) 10 MG 24 hr tablet, Take 10 mg by mouth daily with breakfast. , Disp: , Rfl: 0 .  hydrochlorothiazide (MICROZIDE) 12.5 MG capsule, Take 12.5 mg by mouth daily. , Disp: , Rfl: 0 .  JANUMET XR [phone] MG TB24, Take 1 tablet by mouth daily., Disp: , Rfl: 0   Social History: Reviewed -  reports that she has never smoked. She has never used smokeless tobacco.  Objective Findings:  Vitals: Blood pressure 108/68, pulse 77, height 5\' 6"  (1.676 m), weight 152 lb 6.4 oz (69.1 kg).  Physical Examination: General appearance - alert, well appearing, and in no distress and oriented to person, place, and time Mental status - alert, oriented to  person, place, and time, normal mood, behavior, speech, dress, motor activity, and thought processes, affect appropriate to mood Skin - normal scaring from sebaceous cyst excision   Assessment & Plan:   A:  1. Left shoulder discomfortMusculoskeletal origin 2. Normal scaring from sebaceous cyst excision no sign of recurrence or infection  P:  1. F/u PRN 2. Pt reassured   By signing my name below, I, Margit Banda, attest that this documentation has been prepared under the direction and in the presence of Jonnie Kind, MD. Electronically Signed: Margit Banda, Medical Scribe. 12/24/16. 11:04 AM.  I personally performed the services described in this documentation, which was SCRIBED in my presence. The recorded information has been reviewed and considered accurate. It has been edited as necessary during review. Jonnie Kind, MD

## 2016-12-25 ENCOUNTER — Ambulatory Visit (HOSPITAL_COMMUNITY): Payer: BLUE CROSS/BLUE SHIELD | Admitting: Physical Therapy

## 2016-12-25 ENCOUNTER — Encounter (HOSPITAL_COMMUNITY): Payer: Self-pay | Admitting: Physical Therapy

## 2016-12-25 DIAGNOSIS — M25511 Pain in right shoulder: Secondary | ICD-10-CM | POA: Diagnosis not present

## 2016-12-25 DIAGNOSIS — R293 Abnormal posture: Secondary | ICD-10-CM

## 2016-12-25 DIAGNOSIS — M25611 Stiffness of right shoulder, not elsewhere classified: Secondary | ICD-10-CM

## 2016-12-25 DIAGNOSIS — G8929 Other chronic pain: Secondary | ICD-10-CM

## 2016-12-25 DIAGNOSIS — M6281 Muscle weakness (generalized): Secondary | ICD-10-CM

## 2016-12-25 NOTE — Therapy (Signed)
[facility] [address] [place], [place], [address] Phone: [phone]   Fax:  [phone]  Physical Therapy Treatment  Patient Details  Name: Karina Kirk MRN: [id number] Date of Birth: Apr 29, 1955 Referring Provider: Sydnee Cabal   Encounter Date: 12/25/2016      PT End of Session - 12/25/16 0938    Visit Number 11   Number of Visits 17   Date for PT Re-Evaluation 01/13/17   Authorization Type BCBS Other    Authorization Time Period 11/20/16 to 01/20/17   PT Start Time 0858   PT Stop Time 0937   PT Time Calculation (min) 39 min   Activity Tolerance Patient tolerated treatment well   Behavior During Therapy [facility] for tasks assessed/performed      Past Medical History:  Diagnosis Date  . Diabetes mellitus without complication ([place])   . Elevated cholesterol   . Hypertension   . Sebaceous cyst   . Vaginal discharge 06/22/2015    Past Surgical History:  Procedure Laterality Date  . ABDOMINAL HYSTERECTOMY      There were no vitals filed for this visit.      Subjective Assessment - 12/25/16 0859    Subjective Patient arrives stating no major changes, she felt alright after last session. Her HEP is going OK.    Pertinent History DM, HTN    Patient Stated Goals get out of the sling and get back to moving well    Currently in Pain? No/denies                         [facility] Adult PT Treatment/Exercise - 12/25/16 0001      Shoulder Exercises: Supine   External Rotation PROM;Right;15 reps;Other (comment);AAROM  approx 70 degrees at 80 degrees ABD    Flexion PROM;15 reps;Right;AAROM   Flexion Limitations to tolerance, approx 150-160 degrees    ABduction PROM;Right;15 reps;Other (comment);AAROM   ABduction Limitations scapular plane, approx 160-170 degrees as tolerated      Shoulder Exercises: Seated   Other Seated Exercises R shoulder ER AAROM (towel tucked under elbow, dowel, cues for form)     Shoulder  Exercises: Pulleys   Flexion Limitations x15 at pulleys, cues to reduce compensations     Manual Therapy   Manual Therapy Soft tissue mobilization   Manual therapy comments seperate from rest of session    Soft tissue mobilization R deltoid heads, R pec minor distal attachement                 PT Education - 12/25/16 0937    Education provided Yes   Education Details general progression per protocol, POC    Person(s) Educated Patient   Methods Explanation   Comprehension Verbalized understanding          PT Short Term Goals - 12/16/16 0915      PT SHORT TERM GOAL #1   Title Patient to be compliant with correct performance of HEP, to be updated as protocol allows    Baseline 9/25- compliant    Time 1   Period Weeks   Status Achieved     PT SHORT TERM GOAL #2   Title Patient to be able to maintain correct posture at least 75% of the time without external cues in order to maintain correct mechanics and prevent pain exacerbation    Baseline 9/25- improving, continues to require cues however    Time 4   Period Weeks  Status On-going     PT SHORT TERM GOAL #3   Title Patient to demonstate PROM as being full on all planes (as allowed per protocol) with no pain increase in order to show improvement of condition    Baseline 9/25- improving, limited by pace of protocol    Time 4   Period Weeks   Status On-going           PT Long Term Goals - 12/16/16 0917      PT LONG TERM GOAL #1   Title Patient to demonstrate AAROM as being full in all planes in order to show improvement of condition and good mobilty of shoulder complex    Baseline 9/25- ongoing    Time 8   Status On-going     PT LONG TERM GOAL #2   Title Patient to be able to perform AROM to at least 80% of full on all planes without major compensations in order to show improvement of condition and improving functional task tolerance    Time 8   Period Weeks   Status On-going     PT LONG TERM GOAL #3    Title Patient to be able to perform all selfcare and dressing activities with no increase in pain in order to improve functional task performance    Baseline 9/25- ongoing    Time 8   Period Weeks   Status On-going     PT LONG TERM GOAL #4   Title Patient to demonstrate MMT as being at least 4/5 in all tested R shoulder muscles in order to improve functional task performance    Baseline 9/25- ongoing    Time 8   Period Weeks   Status On-going               Plan - 12/25/16 8315    Clinical Impression Statement Continued with PROM activity for R UE, PROM is progressing very well and is painfree throughout arc so introduced AAROM of R shoulder this session in supine and seated with tactile and verbal cues for correct performance. Noted difficulty avoiding trunk rotation compensations with ER AAROM, requiring cues to correct.  Patient continues to do well with skilled PT services overall; provided education regarding general progression of and through protocol moving forward as well.    Rehab Potential Good   Clinical Impairments Affecting Rehab Potential (+) motivated to participate, moderate outcomes with PT in the past   PT Frequency 2x / week   PT Duration 4 weeks   PT Treatment/Interventions ADLs/Self Care Home Management;Biofeedback;Cryotherapy;Electrical Stimulation;Iontophoresis 4mg /ml Dexamethasone;Moist Heat;Ultrasound;Gait training;DME Instruction;Stair training;Functional mobility training;Therapeutic activities;Therapeutic exercise;Balance training;Neuromuscular re-education;Patient/family education;Manual techniques;Scar mobilization;Passive range of motion;Dry needling   PT Next Visit Plan progress per protocol- week 6 on 10/2. PROM for warmup, then continue with AAROM work. Manual to R pecs.    PT Home Exercise Plan Eval: scapular retractions, chin tucks, 3D cervical excursions, biceps AROM, wrist AROM, grip strength squeezes 10/2- passive shoulder flexion, ABD, ER  stretches    Consulted and Agree with Plan of Care Patient      Patient will benefit from skilled therapeutic intervention in order to improve the following deficits and impairments:  Decreased skin integrity, Improper body mechanics, Decreased coordination, Decreased mobility, Decreased scar mobility, Postural dysfunction, Decreased strength, Decreased range of motion, Hypomobility, Impaired UE functional use, Impaired flexibility  Visit Diagnosis: Chronic right shoulder pain  Abnormal posture  Muscle weakness (generalized)  Stiffness of right shoulder, not elsewhere classified  Problem List Patient Active Problem List   Diagnosis Date Noted  . Inflamed sebaceous cyst left shoulder blade 04/16/2016  . Vaginal discharge 06/22/2015  . Hypertension 11/23/2012  . Diabetes ([place]) 11/23/2012  . Elevated cholesterol 11/23/2012    Deniece Ree PT, DPT [facility] [address] [place], [place], [address] Phone: [phone]   Fax:  [phone]  Name: Karina Kirk MRN: [id number] Date of Birth: [birth year]

## 2016-12-30 ENCOUNTER — Encounter (HOSPITAL_COMMUNITY): Payer: Self-pay

## 2016-12-30 ENCOUNTER — Ambulatory Visit (HOSPITAL_COMMUNITY): Payer: BLUE CROSS/BLUE SHIELD

## 2016-12-30 DIAGNOSIS — M25511 Pain in right shoulder: Secondary | ICD-10-CM | POA: Diagnosis not present

## 2016-12-30 DIAGNOSIS — R293 Abnormal posture: Secondary | ICD-10-CM

## 2016-12-30 DIAGNOSIS — M6281 Muscle weakness (generalized): Secondary | ICD-10-CM

## 2016-12-30 DIAGNOSIS — M25611 Stiffness of right shoulder, not elsewhere classified: Secondary | ICD-10-CM

## 2016-12-30 DIAGNOSIS — G8929 Other chronic pain: Secondary | ICD-10-CM

## 2016-12-30 NOTE — Therapy (Signed)
Karina Kirk, [place], [address] Phone: [phone]   Fax:  [phone]  Physical Therapy Treatment  Patient Details  Name: Karina Kirk MRN: [id number] Date of Birth: 1956-02-14 Referring Provider: Sydnee Kirk   Encounter Date: 12/30/2016      PT End of Session - 12/30/16 0944    Visit Number 12   Number of Visits 17   Date for PT Re-Evaluation 01/13/17   Authorization Type BCBS Other    Authorization Time Period 11/20/16 to 01/20/17   PT Start Time 0902   PT Stop Time 0945   PT Time Calculation (min) 43 min   Activity Tolerance Patient tolerated treatment well   Behavior During Therapy [facility] for tasks assessed/performed      Past Medical History:  Diagnosis Date  . Diabetes mellitus without complication ([place])   . Elevated cholesterol   . Hypertension   . Sebaceous cyst   . Vaginal discharge 06/22/2015    Past Surgical History:  Procedure Laterality Date  . ABDOMINAL HYSTERECTOMY      There were no vitals filed for this visit.      Subjective Assessment - 12/30/16 0902    Subjective Patient notes she continues to work on HEP at home. Denies any pain today and nothing new to report.    Currently in Pain? No/denies                         [facility] Adult PT Treatment/Exercise - 12/30/16 0001      Shoulder Exercises: Supine   External Rotation PROM;Right;15 reps;AAROM   Flexion PROM;15 reps;Right;AAROM   Flexion Limitations to tolerance, approx 150-160 degrees    ABduction PROM;Right;15 reps;AAROM   ABduction Limitations scapular plane, approx 160-170 degrees as tolerated      Shoulder Exercises: Seated   Retraction 20 reps   Flexion PROM;5 reps   Flexion Limitations Roll back on chair   Other Seated Exercises R shoulder ER AAROM (towel tucked under elbow, dowel, cues for form)   Other Seated Exercises bicep curl 5# 1x20     Shoulder Exercises: Pulleys   Flexion Limitations  x15 at pulleys, cues to reduce compensations     Shoulder Exercises: Isometric Strengthening   Flexion 5X10"   Extension 5X10"   ABduction 5X10"   ADduction 5X10"                  PT Short Term Goals - 12/16/16 0915      PT SHORT TERM GOAL #1   Title Patient to be compliant with correct performance of HEP, to be updated as protocol allows    Baseline 9/25- compliant    Time 1   Period Weeks   Status Achieved     PT SHORT TERM GOAL #2   Title Patient to be able to maintain correct posture at least 75% of the time without external cues in order to maintain correct mechanics and prevent pain exacerbation    Baseline 9/25- improving, continues to require cues however    Time 4   Period Weeks   Status On-going     PT SHORT TERM GOAL #3   Title Patient to demonstate PROM as being full on all planes (as allowed per protocol) with no pain increase in order to show improvement of condition    Baseline 9/25- improving, limited by pace of protocol    Time 4   Period Weeks  Status On-going           PT Long Term Goals - 12/16/16 0917      PT LONG TERM GOAL #1   Title Patient to demonstrate AAROM as being full in all planes in order to show improvement of condition and good mobilty of shoulder complex    Baseline 9/25- ongoing    Time 8   Status On-going     PT LONG TERM GOAL #2   Title Patient to be able to perform AROM to at least 80% of full on all planes without major compensations in order to show improvement of condition and improving functional task tolerance    Time 8   Period Weeks   Status On-going     PT LONG TERM GOAL #3   Title Patient to be able to perform all selfcare and dressing activities with no increase in pain in order to improve functional task performance    Baseline 9/25- ongoing    Time 8   Period Weeks   Status On-going     PT LONG TERM GOAL #4   Title Patient to demonstrate MMT as being at least 4/5 in all tested R shoulder muscles  in order to improve functional task performance    Baseline 9/25- ongoing    Time 8   Period Weeks   Status On-going               Plan - 12/30/16 0947    Clinical Impression Statement Today's session focused on continued Rt. Shoulder mobility passive and active assisted. She continues to deny any increase in pain or symptoms at EOS. She continues to note a "good stretch" at the end of available range. Pt tolerated isometrics per protocol and passive shoulder flexion stretch well.    Rehab Potential Good   Clinical Impairments Affecting Rehab Potential (+) motivated to participate, moderate outcomes with PT in the past   PT Frequency 2x / week   PT Duration 4 weeks   PT Treatment/Interventions ADLs/Self Care Home Management;Biofeedback;Cryotherapy;Electrical Stimulation;Iontophoresis 4mg /ml Dexamethasone;Moist Heat;Ultrasound;Gait training;DME Instruction;Stair training;Functional mobility training;Therapeutic activities;Therapeutic exercise;Balance training;Neuromuscular re-education;Patient/family education;Manual techniques;Scar mobilization;Passive range of motion;Dry needling   PT Next Visit Plan progress per protocol- week 6 on 10/2. PROM for warmup, then continue with AAROM work. Manual to R pecs.    PT Home Exercise Plan Eval: scapular retractions, chin tucks, 3D cervical excursions, biceps AROM, wrist AROM, grip strength squeezes 10/2- passive shoulder flexion, ABD, ER stretches    Consulted and Agree with Plan of Care Patient      Patient will benefit from skilled therapeutic intervention in order to improve the following deficits and impairments:  Decreased skin integrity, Improper body mechanics, Decreased coordination, Decreased mobility, Decreased scar mobility, Postural dysfunction, Decreased strength, Decreased range of motion, Hypomobility, Impaired UE functional use, Impaired flexibility  Visit Diagnosis: Chronic right shoulder pain  Abnormal posture  Muscle  weakness (generalized)  Stiffness of right shoulder, not elsewhere classified     Problem List Patient Active Problem List   Diagnosis Date Noted  . Inflamed sebaceous cyst left shoulder blade 04/16/2016  . Vaginal discharge 06/22/2015  . Hypertension 11/23/2012  . Diabetes ([place]) 11/23/2012  . Elevated cholesterol 11/23/2012   Starr Lake PT, DPT 9:48 AM, 12/30/16 [place], [place], [address] Phone: [phone]   Fax:  [phone]  Name: Karina Kirk MRN: [id number] Date of Birth: 03/11/1956

## 2017-01-01 ENCOUNTER — Encounter (HOSPITAL_COMMUNITY): Payer: Self-pay | Admitting: Physical Therapy

## 2017-01-01 ENCOUNTER — Ambulatory Visit (HOSPITAL_COMMUNITY): Payer: BLUE CROSS/BLUE SHIELD | Admitting: Physical Therapy

## 2017-01-01 DIAGNOSIS — M25511 Pain in right shoulder: Principal | ICD-10-CM

## 2017-01-01 DIAGNOSIS — M6281 Muscle weakness (generalized): Secondary | ICD-10-CM

## 2017-01-01 DIAGNOSIS — G8929 Other chronic pain: Secondary | ICD-10-CM

## 2017-01-01 DIAGNOSIS — M25611 Stiffness of right shoulder, not elsewhere classified: Secondary | ICD-10-CM

## 2017-01-01 DIAGNOSIS — R293 Abnormal posture: Secondary | ICD-10-CM

## 2017-01-01 NOTE — Therapy (Signed)
[facility] [address] [place], [place], [address] Phone: [phone]   Fax:  [phone]  Physical Therapy Treatment  Patient Details  Name: Karina Kirk MRN: [id number] Date of Birth: 07-07-1955 Referring Provider: Sydnee Cabal   Encounter Date: 01/01/2017      PT End of Session - 01/01/17 0940    Visit Number 13   Number of Visits 17   Date for PT Re-Evaluation 01/13/17   Authorization Type BCBS Other    Authorization Time Period 11/20/16 to 01/20/17   PT Start Time 0857   PT Stop Time 0935   PT Time Calculation (min) 38 min   Activity Tolerance Patient tolerated treatment well   Behavior During Therapy [facility] for tasks assessed/performed      Past Medical History:  Diagnosis Date  . Diabetes mellitus without complication ([place])   . Elevated cholesterol   . Hypertension   . Sebaceous cyst   . Vaginal discharge 06/22/2015    Past Surgical History:  Procedure Laterality Date  . ABDOMINAL HYSTERECTOMY      There were no vitals filed for this visit.      Subjective Assessment - 01/01/17 0858    Subjective patient arrives reporting she is diong well today, no major problems or changes   Pertinent History DM, HTN    Patient Stated Goals get out of the sling and get back to moving well    Currently in Pain? No/denies                         [facility] Adult PT Treatment/Exercise - 01/01/17 0001      Shoulder Exercises: Supine   External Rotation PROM;AAROM;15 reps;Right  full ROM at 90 degrees ABD PROM; AAROM as tolerated    Flexion PROM;AROM;15 reps  170-180 degrees PROM; to tolerance AAROM    ABduction PROM;AAROM;Right;15 reps  170-180 degrees PROM; to tolerance AAROM    Other Supine Exercises supine stabilization isometrics 3 60 second round supine randomized      Shoulder Exercises: Seated   Flexion AAROM;Right;15 reps   Flexion Limitations pulleys    Other Seated Exercises shoulder rolls "up  back down" 1x20                 PT Education - 01/01/17 0940    Education provided Yes   Education Details current functional status per protocol, doing very well with PT; possible progression if allowed per protocol next week, POC moving forward    Person(s) Educated Patient   Methods Explanation   Comprehension Verbalized understanding          PT Short Term Goals - 12/16/16 0915      PT SHORT TERM GOAL #1   Title Patient to be compliant with correct performance of HEP, to be updated as protocol allows    Baseline 9/25- compliant    Time 1   Period Weeks   Status Achieved     PT SHORT TERM GOAL #2   Title Patient to be able to maintain correct posture at least 75% of the time without external cues in order to maintain correct mechanics and prevent pain exacerbation    Baseline 9/25- improving, continues to require cues however    Time 4   Period Weeks   Status On-going     PT SHORT TERM GOAL #3   Title Patient to demonstate PROM as being full on all planes (as allowed per protocol)  with no pain increase in order to show improvement of condition    Baseline 9/25- improving, limited by pace of protocol    Time 4   Period Weeks   Status On-going           PT Long Term Goals - 12/16/16 0917      PT LONG TERM GOAL #1   Title Patient to demonstrate AAROM as being full in all planes in order to show improvement of condition and good mobilty of shoulder complex    Baseline 9/25- ongoing    Time 8   Status On-going     PT LONG TERM GOAL #2   Title Patient to be able to perform AROM to at least 80% of full on all planes without major compensations in order to show improvement of condition and improving functional task tolerance    Time 8   Period Weeks   Status On-going     PT LONG TERM GOAL #3   Title Patient to be able to perform all selfcare and dressing activities with no increase in pain in order to improve functional task performance    Baseline 9/25-  ongoing    Time 8   Period Weeks   Status On-going     PT LONG TERM GOAL #4   Title Patient to demonstrate MMT as being at least 4/5 in all tested R shoulder muscles in order to improve functional task performance    Baseline 9/25- ongoing    Time 8   Period Weeks   Status On-going               Plan - 01/01/17 0941    Clinical Impression Statement Continued with PROM as warmup, noting generally full PROM today with only patient reports of stretching and stiffness today. Also continued with AAROM with dowel, corrections to form provided as needed today. Patient continues to do well, she reports she has her next MD appointment next week and should be ready to progress to next stage per protocol as appropriate.    Rehab Potential Good   Clinical Impairments Affecting Rehab Potential (+) motivated to participate, moderate outcomes with PT in the past   PT Frequency 2x / week   PT Duration 4 weeks   PT Treatment/Interventions ADLs/Self Care Home Management;Biofeedback;Cryotherapy;Electrical Stimulation;Iontophoresis 4mg /ml Dexamethasone;Moist Heat;Ultrasound;Gait training;DME Instruction;Stair training;Functional mobility training;Therapeutic activities;Therapeutic exercise;Balance training;Neuromuscular re-education;Patient/family education;Manual techniques;Scar mobilization;Passive range of motion;Dry needling   PT Next Visit Plan progress per protocol- week 8 on 10/16, progress as able. continue with PROM/AAROM for quick warmup, but progression per protocol otherwise.    PT Home Exercise Plan Eval: scapular retractions, chin tucks, 3D cervical excursions, biceps AROM, wrist AROM, grip strength squeezes 10/2- passive shoulder flexion, ABD, ER stretches    Consulted and Agree with Plan of Care Patient      Patient will benefit from skilled therapeutic intervention in order to improve the following deficits and impairments:  Decreased skin integrity, Improper body mechanics, Decreased  coordination, Decreased mobility, Decreased scar mobility, Postural dysfunction, Decreased strength, Decreased range of motion, Hypomobility, Impaired UE functional use, Impaired flexibility  Visit Diagnosis: Chronic right shoulder pain  Abnormal posture  Muscle weakness (generalized)  Stiffness of right shoulder, not elsewhere classified     Problem List Patient Active Problem List   Diagnosis Date Noted  . Inflamed sebaceous cyst left shoulder blade 04/16/2016  . Vaginal discharge 06/22/2015  . Hypertension 11/23/2012  . Diabetes ([place]) 11/23/2012  . Elevated cholesterol 11/23/2012  Deniece Ree PT, DPT [phone]  [facility] [address] [place], [place], [address] Phone: [phone]   Fax:  [phone]  Name: Karina Kirk MRN: [id number] Date of Birth: 23-Jan-1956

## 2017-01-06 ENCOUNTER — Ambulatory Visit (HOSPITAL_COMMUNITY): Payer: BLUE CROSS/BLUE SHIELD | Admitting: Physical Therapy

## 2017-01-06 ENCOUNTER — Encounter (HOSPITAL_COMMUNITY): Payer: Self-pay | Admitting: Physical Therapy

## 2017-01-06 DIAGNOSIS — R293 Abnormal posture: Secondary | ICD-10-CM

## 2017-01-06 DIAGNOSIS — M25511 Pain in right shoulder: Secondary | ICD-10-CM | POA: Diagnosis not present

## 2017-01-06 DIAGNOSIS — M6281 Muscle weakness (generalized): Secondary | ICD-10-CM

## 2017-01-06 DIAGNOSIS — M25611 Stiffness of right shoulder, not elsewhere classified: Secondary | ICD-10-CM

## 2017-01-06 DIAGNOSIS — G8929 Other chronic pain: Secondary | ICD-10-CM
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
## 2017-01-08 ENCOUNTER — Encounter (HOSPITAL_COMMUNITY): Payer: Self-pay | Admitting: Physical Therapy

## 2017-01-08 ENCOUNTER — Ambulatory Visit (HOSPITAL_COMMUNITY): Payer: BLUE CROSS/BLUE SHIELD | Admitting: Physical Therapy

## 2017-01-08 DIAGNOSIS — M25511 Pain in right shoulder: Principal | ICD-10-CM

## 2017-01-08 DIAGNOSIS — R293 Abnormal posture: Secondary | ICD-10-CM

## 2017-01-08 DIAGNOSIS — M25611 Stiffness of right shoulder, not elsewhere classified: Secondary | ICD-10-CM

## 2017-01-08 DIAGNOSIS — M6281 Muscle weakness (generalized): Secondary | ICD-10-CM

## 2017-01-08 DIAGNOSIS — G8929 Other chronic pain: Secondary | ICD-10-CM

## 2017-01-08 NOTE — Patient Instructions (Signed)
   supine shoulder flexion  Lying on your back, with your arm straight up in front of you; try to get your arm as high as you can. Your thumb should be up as if you were holding a can of soda.   Repeat 15 times, twice a day.    Supine Shoulder Abduction  Start lying on your back.Keeping your thumb facing "up", move your right arm out to the side and up (as if you were doing a jumping jack or making a snow angel)  As high as you can. You do not need the rod shown in the picture.   Repeat 15 times, twice a day.     Supine humeral external rotation  Start laying on your back with a towel roll(s) near your elbow joint, to keep the upper arm in the same plane as the shoulder joint.    External Rotation: Rotate your arm so the back of your hand (knuckle side) moves toward the floor.  Only go as far as you can without moving your shoulder from the towel.  Bring your arm back to the starting position.  Repeat 15 times, twice a day.

## 2017-01-08 NOTE — Therapy (Signed)
[place], [place], [address] Phone: [phone]   Fax:  [phone]  Physical Therapy Treatment  Patient Details  Name: Karina Kirk MRN: [id number] Date of Birth: Jun 11, 1955 Referring Provider: Sydnee Cabal   Encounter Date: 01/08/2017      PT End of Session - 01/08/17 0941    Visit Number 15   Number of Visits 17   Date for PT Re-Evaluation 01/13/17   Authorization Type BCBS Other    Authorization Time Period 11/20/16 to 01/20/17   PT Start Time 0901   PT Stop Time 0939   PT Time Calculation (min) 38 min   Activity Tolerance Patient tolerated treatment well   Behavior During Therapy [facility] for tasks assessed/performed      Past Medical History:  Diagnosis Date  . Diabetes mellitus without complication ([place])   . Elevated cholesterol   . Hypertension   . Sebaceous cyst   . Vaginal discharge 06/22/2015    Past Surgical History:  Procedure Laterality Date  . ABDOMINAL HYSTERECTOMY      There were no vitals filed for this visit.      Subjective Assessment - 01/08/17 0904    Subjective Paitent arrives stating that she saw her MD yesterday, he is happy with her progress and told her to keep on coming to PT. No other changes.    Pertinent History DM, HTN    Patient Stated Goals get out of the sling and get back to moving well    Currently in Pain? No/denies                         [facility] Adult PT Treatment/Exercise - 01/08/17 0001      Shoulder Exercises: Supine   External Rotation PROM;AROM;Right;10 reps;15 reps  10 reps PROM, 15 AROM    Flexion PROM;AROM;Right;10 reps;15 reps  10 reps PROM, 15 reps AROM    ABduction PROM;AROM;10 reps;Right;15 reps  10 reps PROM, 15 reps AROM    Other Supine Exercises supine stabilization isometrics 1x120 seconds second round supine randomized 2# weight hold    Other Supine Exercises serratus punches 1x10 2#     Shoulder Exercises: Seated   Flexion AROM;Right;15 reps   Flexion Limitations partial ROM, would benefit from AAROM/further strengthening of ROM in supine    Other Seated Exercises scapular retractions 1x20 3 second holds    Other Seated Exercises shoulder rolls 1x20      Shoulder Exercises: Standing   ABduction Right;AAROM;15 reps   ABduction Limitations with dowel    Other Standing Exercises standing R shoulder ER AROM towel under elbow cues for form 1x15                PT Education - 01/08/17 0941    Education provided Yes   Education Details HEP updates, re-assess next session    Person(s) Educated Patient   Methods Explanation;Handout   Comprehension Verbalized understanding;Returned demonstration          PT Short Term Goals - 12/16/16 0915      PT SHORT TERM GOAL #1   Title Patient to be compliant with correct performance of HEP, to be updated as protocol allows    Baseline 9/25- compliant    Time 1   Period Weeks   Status Achieved     PT SHORT TERM GOAL #2   Title Patient to be able to maintain correct posture at least 75% of the  time without external cues in order to maintain correct mechanics and prevent pain exacerbation    Baseline 9/25- improving, continues to require cues however    Time 4   Period Weeks   Status On-going     PT SHORT TERM GOAL #3   Title Patient to demonstate PROM as being full on all planes (as allowed per protocol) with no pain increase in order to show improvement of condition    Baseline 9/25- improving, limited by pace of protocol    Time 4   Period Weeks   Status On-going           PT Long Term Goals - 12/16/16 0917      PT LONG TERM GOAL #1   Title Patient to demonstrate AAROM as being full in all planes in order to show improvement of condition and good mobilty of shoulder complex    Baseline 9/25- ongoing    Time 8   Status On-going     PT LONG TERM GOAL #2   Title Patient to be able to perform AROM to at least 80% of full on all planes  without major compensations in order to show improvement of condition and improving functional task tolerance    Time 8   Period Weeks   Status On-going     PT LONG TERM GOAL #3   Title Patient to be able to perform all selfcare and dressing activities with no increase in pain in order to improve functional task performance    Baseline 9/25- ongoing    Time 8   Period Weeks   Status On-going     PT LONG TERM GOAL #4   Title Patient to demonstrate MMT as being at least 4/5 in all tested R shoulder muscles in order to improve functional task performance    Baseline 9/25- ongoing    Time 8   Period Weeks   Status On-going               Plan - 01/08/17 0941    Clinical Impression Statement Continued with PROM for warmup then proceeded with general AROM/AAROM training as patient is doing quite well with no pain at this point; AROM performed in gravity present and eliminated positions today with monitoring for form this session, adjusted to [facility] as appropriate. Recommend re-assessment next session, however it is expected that patient will continue to require skilled PT services to continue course of post-surgical rehab.    Rehab Potential Good   Clinical Impairments Affecting Rehab Potential (+) motivated to participate, moderate outcomes with PT in the past   PT Frequency 2x / week   PT Duration 4 weeks   PT Treatment/Interventions ADLs/Self Care Home Management;Biofeedback;Cryotherapy;Electrical Stimulation;Iontophoresis 4mg /ml Dexamethasone;Moist Heat;Ultrasound;Gait training;DME Instruction;Stair training;Functional mobility training;Therapeutic activities;Therapeutic exercise;Balance training;Neuromuscular re-education;Patient/family education;Manual techniques;Scar mobilization;Passive range of motion;Dry needling   PT Next Visit Plan re-assess. Weekn 9 per protocol on 10/23.    PT Home Exercise Plan Eval: scapular retractions, chin tucks, 3D cervical excursions, biceps AROM,  wrist AROM, grip strength squeezes 10/2- passive shoulder flexion, ABD, ER stretches 10/18- supine AROM of flexion, abduction, ER     Consulted and Agree with Plan of Care Patient      Patient will benefit from skilled therapeutic intervention in order to improve the following deficits and impairments:  Decreased skin integrity, Improper body mechanics, Decreased coordination, Decreased mobility, Decreased scar mobility, Postural dysfunction, Decreased strength, Decreased range of motion, Hypomobility, Impaired UE functional use, Impaired flexibility  Visit  Diagnosis: Chronic right shoulder pain  Abnormal posture  Muscle weakness (generalized)  Stiffness of right shoulder, not elsewhere classified     Problem List Patient Active Problem List   Diagnosis Date Noted  . Inflamed sebaceous cyst left shoulder blade 04/16/2016  . Vaginal discharge 06/22/2015  . Hypertension 11/23/2012  . Diabetes ([place]) 11/23/2012  . Elevated cholesterol 11/23/2012    Deniece Ree PT, DPT [place] [address] [place], [place], [address] Phone: [phone]   Fax:  [phone]  Name: Karina Kirk MRN: [id number] Date of Birth: October 12, 1955

## 2017-01-13 ENCOUNTER — Encounter (HOSPITAL_COMMUNITY): Payer: Self-pay | Admitting: Physical Therapy

## 2017-01-13 ENCOUNTER — Ambulatory Visit (HOSPITAL_COMMUNITY): Payer: BLUE CROSS/BLUE SHIELD | Admitting: Physical Therapy

## 2017-01-13 DIAGNOSIS — M25611 Stiffness of right shoulder, not elsewhere classified: Secondary | ICD-10-CM

## 2017-01-13 DIAGNOSIS — G8929 Other chronic pain: Secondary | ICD-10-CM

## 2017-01-13 DIAGNOSIS — M25511 Pain in right shoulder: Principal | ICD-10-CM

## 2017-01-13 DIAGNOSIS — R293 Abnormal posture: Secondary | ICD-10-CM

## 2017-01-13 DIAGNOSIS — M6281 Muscle weakness (generalized): Secondary | ICD-10-CM

## 2017-01-13 NOTE — Therapy (Signed)
[place] [address] [place], [place], [address] Phone: [phone]   Fax:  [phone]  Physical Therapy Treatment (Re-Assessment)  Patient Details  Name: Karina Kirk MRN: [id number] Date of Birth: March 22, 1956 Referring Provider: Sydnee Cabal   Encounter Date: 01/13/2017      PT End of Session - 01/13/17 0949    Visit Number 16   Number of Visits 24   Date for PT Re-Evaluation 02/10/17   Authorization Type BCBS Other    Authorization Time Period 11/20/16 to 01/20/17; new 01/13/17 to 02/13/17   PT Start Time 0903   PT Stop Time 0942   PT Time Calculation (min) 39 min   Activity Tolerance Patient tolerated treatment well   Behavior During Therapy [facility] for tasks assessed/performed      Past Medical History:  Diagnosis Date  . Diabetes mellitus without complication ([place])   . Elevated cholesterol   . Hypertension   . Sebaceous cyst   . Vaginal discharge 06/22/2015    Past Surgical History:  Procedure Laterality Date  . ABDOMINAL HYSTERECTOMY      There were no vitals filed for this visit.      Subjective Assessment - 01/13/17 0905    Subjective Patient arrives stating she is doing well, same old same old, no major changes and she felt good after last session    Pertinent History DM, HTN    Patient Stated Goals get out of the sling and get back to moving well    Currently in Pain? No/denies            [facility] PT Assessment - 01/13/17 0001      Assessment   Medical Diagnosis complete R RTC tear    Referring Provider Sydnee Cabal    Onset Date/Surgical Date 11/13/16   Next MD Visit Dr. Theda Sers November 28th     Prior Therapy PT for her back and shoulder earlier this year      Precautions   Precautions Shoulder   Precaution Comments using  Orthopedics protocol      Restrictions   Weight Bearing Restrictions No     Balance Screen   Has the patient fallen in the past 6 months No   Has the  patient had a decrease in activity level because of a fear of falling?  No   Is the patient reluctant to leave their home because of a fear of falling?  No     Prior Function   Level of Independence Independent;Independent with basic ADLs;Independent with gait;Independent with transfers   Vocation Full time employment   [employer]     AROM   Right Shoulder Flexion 112 Degrees  "feels heavy"   Right Shoulder ABduction 140 Degrees  trunk compensations    Right Shoulder Internal Rotation --  L1    Right Shoulder External Rotation --  C7     PROM   Right Shoulder Flexion 160 Degrees   Right Shoulder ABduction 175 Degrees   Right Shoulder Internal Rotation --  full ROM    Right Shoulder External Rotation 88 Degrees     Strength   Right Shoulder Flexion 3-/5   Right Shoulder ABduction 3-/5   Right Shoulder Internal Rotation 4-/5   Right Shoulder External Rotation 4/5   Left Shoulder Flexion 5/5   Left Shoulder ABduction 5/5   Left Shoulder Internal Rotation 5/5   Left Shoulder External Rotation 5/5   Right Elbow Flexion 5/5  Right Elbow Extension 4+/5   Left Elbow Flexion 5/5   Left Elbow Extension 5/5                     [facility] Adult PT Treatment/Exercise - 01/13/17 0001      Shoulder Exercises: Supine   External Rotation AROM;Strengthening;15 reps;10 reps;Weights  15 reps/1 set 0#; 10 reps/2 sets 1#    External Rotation Weight (lbs) 1   Flexion AROM;Strengthening;Right;15 reps;10 reps;Weights  15 reps/1set 0#; 10 reps/2 sets with weights (1#)    Shoulder Flexion Weight (lbs) 1   ABduction AROM;Strengthening;Right;10 reps;15 reps;Weights  15 reps/1set  with 0#; 10 reps/2 sets with 1#    Shoulder ABduction Weight (lbs) 1     Shoulder Exercises: Sidelying   External Rotation Strengthening;Right;20 reps;Weights  1#      Shoulder Exercises: Standing   Flexion Right;AAROM;20 reps  dowel    ABduction Right;AAROM;15 reps  dowel                  PT Education - 01/13/17 0948    Education provided Yes   Education Details progress with skilled PT services, POC moving forward, status in protocol    Person(s) Educated Patient   Methods Explanation   Comprehension Verbalized understanding          PT Short Term Goals - 01/13/17 0918      PT SHORT TERM GOAL #1   Title Patient to be compliant with correct performance of HEP, to be updated as protocol allows    Baseline 10/23- compliant    Time 1   Period Weeks   Status Achieved     PT SHORT TERM GOAL #2   Title Patient to be able to maintain correct posture at least 75% of the time without external cues in order to maintain correct mechanics and prevent pain exacerbation    Baseline 10/23- improving   Time 4   Period Weeks   Status Partially Met     PT SHORT TERM GOAL #3   Title Patient to demonstate PROM as being full on all planes (as allowed per protocol) with no pain increase in order to show improvement of condition    Baseline 10/23- 160 shoulder flexion but this is functional, otherwsie goal is met    Time 4   Period Weeks   Status Partially Met           PT Long Term Goals - 01/13/17 0919      PT LONG TERM GOAL #1   Title Patient to demonstrate AAROM as being full in all planes in order to show improvement of condition and good mobilty of shoulder complex    Baseline 10/23- mild stiffness but remains functional    Time 8   Period Weeks   Status Partially Met     PT LONG TERM GOAL #2   Title Patient to be able to perform AROM to at least 80% of full on all planes without major compensations in order to show improvement of condition and improving functional task tolerance    Baseline 10/23- compensations present    Time 8   Period Weeks   Status On-going     PT LONG TERM GOAL #3   Title Patient to be able to perform all selfcare and dressing activities with no increase in pain in order to improve functional task performance     Baseline 10/23- DNT    Time 8   Period Weeks  Status On-going     PT LONG TERM GOAL #4   Title Patient to demonstrate MMT as being at least 4/5 in all tested R shoulder muscles in order to improve functional task performance    Baseline 10/23- ongoing    Time 8   Period Weeks   Status On-going               Plan - 01/13/17 0950    Clinical Impression Statement Re-assessment performed today. Patient is making good progress with skilled PT services; while she does continue to demonstrate some mild stiffness with flexion ROM, it remains functional and will likely continue to improve with ongoing skilled PT services. She is now at week 9 in her protocol and is appropriate for AROM and light resistive training, which is appropriate as functional strength is now one of her biggest deficits and concerns. Recommend skilled PT services to address functional limitations and continue assisting in return to optimal level of function moving forward.    Rehab Potential Good   Clinical Impairments Affecting Rehab Potential (+) motivated to participate, moderate outcomes with PT in the past   PT Frequency 2x / week   PT Duration 4 weeks   PT Treatment/Interventions ADLs/Self Care Home Management;Biofeedback;Cryotherapy;Electrical Stimulation;Iontophoresis 41m/ml Dexamethasone;Moist Heat;Ultrasound;Gait training;DME Instruction;Stair training;Functional mobility training;Therapeutic activities;Therapeutic exercise;Balance training;Neuromuscular re-education;Patient/family education;Manual techniques;Scar mobilization;Passive range of motion;Dry needling   PT Next Visit Plan Week 9 per protocol on 10/23; AROM, light resistance training    PT Home Exercise Plan Eval: scapular retractions, chin tucks, 3D cervical excursions, biceps AROM, wrist AROM, grip strength squeezes 10/2- passive shoulder flexion, ABD, ER stretches 10/18- supine AROM of flexion, abduction, ER     Consulted and Agree with Plan of  Care Patient      Patient will benefit from skilled therapeutic intervention in order to improve the following deficits and impairments:  Decreased skin integrity, Improper body mechanics, Decreased coordination, Decreased mobility, Decreased scar mobility, Postural dysfunction, Decreased strength, Decreased range of motion, Hypomobility, Impaired UE functional use, Impaired flexibility  Visit Diagnosis: Chronic right shoulder pain - Plan: PT plan of care cert/re-cert  Abnormal posture - Plan: PT plan of care cert/re-cert  Muscle weakness (generalized) - Plan: PT plan of care cert/re-cert  Stiffness of right shoulder, not elsewhere classified - Plan: PT plan of care cert/re-cert     Problem List Patient Active Problem List   Diagnosis Date Noted  . Inflamed sebaceous cyst left shoulder blade 04/16/2016  . Vaginal discharge 06/22/2015  . Hypertension 11/23/2012  . Diabetes ([place] 11/23/2012  . Elevated cholesterol 11/23/2012    KDeniece ReePT, DPT [address] [place] [address]Phone: [phone]  Fax:  [phone] Name: WCHRISTELLE IGOEMRN: [id number] of Birth: 908-11-1955

## 2017-01-15 ENCOUNTER — Ambulatory Visit (HOSPITAL_COMMUNITY): Payer: BLUE CROSS/BLUE SHIELD | Admitting: Physical Therapy

## 2017-01-15 ENCOUNTER — Encounter (HOSPITAL_COMMUNITY): Payer: Self-pay | Admitting: Physical Therapy

## 2017-01-15 DIAGNOSIS — M25611 Stiffness of right shoulder, not elsewhere classified: Secondary | ICD-10-CM

## 2017-01-15 DIAGNOSIS — R293 Abnormal posture: Secondary | ICD-10-CM

## 2017-01-15 DIAGNOSIS — M6281 Muscle weakness (generalized): Secondary | ICD-10-CM

## 2017-01-15 DIAGNOSIS — G8929 Other chronic pain: Secondary | ICD-10-CM

## 2017-01-15 DIAGNOSIS — M25511 Pain in right shoulder: Secondary | ICD-10-CM | POA: Diagnosis not present

## 2017-01-15 NOTE — Therapy (Signed)
[place], [place], [address] Phone: [phone]   Fax:  [phone]  Physical Therapy Treatment  Patient Details  Name: Karina Kirk MRN: [id number] Date of Birth: May 31, 1955 Referring Provider: Sydnee Cabal   Encounter Date: 01/15/2017      PT End of Session - 01/15/17 0941    Visit Number 17   Number of Visits 24   Date for PT Re-Evaluation 02/10/17   Authorization Type BCBS Other    Authorization Time Period 11/20/16 to 01/20/17; new 01/13/17 to 02/13/17   PT Start Time 0903   PT Stop Time 0941   PT Time Calculation (min) 38 min   Activity Tolerance Patient tolerated treatment well   Behavior During Therapy [facility] for tasks assessed/performed      Past Medical History:  Diagnosis Date  . Diabetes mellitus without complication ([place])   . Elevated cholesterol   . Hypertension   . Sebaceous cyst   . Vaginal discharge 06/22/2015    Past Surgical History:  Procedure Laterality Date  . ABDOMINAL HYSTERECTOMY      There were no vitals filed for this visit.      Subjective Assessment - 01/15/17 0904    Subjective Patient arrives stating she is feeling good, she is loosening up    Pertinent History DM, HTN    Patient Stated Goals get out of the sling and get back to moving well    Currently in Pain? No/denies                         [facility] Adult PT Treatment/Exercise - 01/15/17 0001      Shoulder Exercises: Supine   External Rotation AROM;Strengthening;Right;10 reps;Weights;15 reps  x15 with 1#; 2 sets of 10 with 2#. SIDELYING for all    Flexion AROM;Strengthening;15 reps;Weights;Right;10 reps  0# x15; 1# x15; 2# x10   ABduction AROM;Strengthening;10 reps;15 reps;Weights;Right  15 c 0#; 2# x10; 3# x10     Shoulder Exercises: Standing   Flexion AAROM;Right;Strengthening;20 reps;Other (comment)  dowel assist    ABduction Right;Strengthening;20 reps;AAROM  dowel assist    Other  Standing Exercises IR towel stretch 3x30 seconds    Other Standing Exercises wall pushups 1x10; ball wall circles 1x10 CW and CCW; ball wall alphabet x1                PT Education - 01/15/17 0941    Education provided No          PT Short Term Goals - 01/13/17 0918      PT SHORT TERM GOAL #1   Title Patient to be compliant with correct performance of HEP, to be updated as protocol allows    Baseline 10/23- compliant    Time 1   Period Weeks   Status Achieved     PT SHORT TERM GOAL #2   Title Patient to be able to maintain correct posture at least 75% of the time without external cues in order to maintain correct mechanics and prevent pain exacerbation    Baseline 10/23- improving   Time 4   Period Weeks   Status Partially Met     PT SHORT TERM GOAL #3   Title Patient to demonstate PROM as being full on all planes (as allowed per protocol) with no pain increase in order to show improvement of condition    Baseline 10/23- 160 shoulder flexion but this is functional, otherwsie goal is met  Time 4   Period Weeks   Status Partially Met           PT Long Term Goals - 01/13/17 0919      PT LONG TERM GOAL #1   Title Patient to demonstrate AAROM as being full in all planes in order to show improvement of condition and good mobilty of shoulder complex    Baseline 10/23- mild stiffness but remains functional    Time 8   Period Weeks   Status Partially Met     PT LONG TERM GOAL #2   Title Patient to be able to perform AROM to at least 80% of full on all planes without major compensations in order to show improvement of condition and improving functional task tolerance    Baseline 10/23- compensations present    Time 8   Period Weeks   Status On-going     PT LONG TERM GOAL #3   Title Patient to be able to perform all selfcare and dressing activities with no increase in pain in order to improve functional task performance    Baseline 10/23- DNT    Time 8    Period Weeks   Status On-going     PT LONG TERM GOAL #4   Title Patient to demonstrate MMT as being at least 4/5 in all tested R shoulder muscles in order to improve functional task performance    Baseline 10/23- ongoing    Time 8   Period Weeks   Status On-going               Plan - 01/15/17 0942    Clinical Impression Statement Continued with general progression of AROM (AAROM as needed in gravity present positions) and functional strengthening of shoulder today, form corrections and cues provided as needed/necessary. Strength is improving but patient continues to need dowel assist for gravity present flexion and abduction of R shoulder due to weakness.  Patient continues to do well and generally remains motivated with skilled PT services, reporting she is looking forward to the challenge of strengthening her shoulder/eventual return to work.    Rehab Potential Good   Clinical Impairments Affecting Rehab Potential (+) motivated to participate, moderate outcomes with PT in the past   PT Frequency 2x / week   PT Duration 4 weeks   PT Treatment/Interventions ADLs/Self Care Home Management;Biofeedback;Cryotherapy;Electrical Stimulation;Iontophoresis 15m/ml Dexamethasone;Moist Heat;Ultrasound;Gait training;DME Instruction;Stair training;Functional mobility training;Therapeutic activities;Therapeutic exercise;Balance training;Neuromuscular re-education;Patient/family education;Manual techniques;Scar mobilization;Passive range of motion;Dry needling   PT Next Visit Plan Week 9 per protocol on 10/23; AROM, light resistance training    PT Home Exercise Plan Eval: scapular retractions, chin tucks, 3D cervical excursions, biceps AROM, wrist AROM, grip strength squeezes 10/2- passive shoulder flexion, ABD, ER stretches 10/18- supine AROM of flexion, abduction, ER     Consulted and Agree with Plan of Care Patient      Patient will benefit from skilled therapeutic intervention in order to improve  the following deficits and impairments:  Decreased skin integrity, Improper body mechanics, Decreased coordination, Decreased mobility, Decreased scar mobility, Postural dysfunction, Decreased strength, Decreased range of motion, Hypomobility, Impaired UE functional use, Impaired flexibility  Visit Diagnosis: Chronic right shoulder pain  Abnormal posture  Muscle weakness (generalized)  Stiffness of right shoulder, not elsewhere classified     Problem List Patient Active Problem List   Diagnosis Date Noted  . Inflamed sebaceous cyst left shoulder blade 04/16/2016  . Vaginal discharge 06/22/2015  . Hypertension 11/23/2012  . [place] 11/[place]  .  Elevated cholesterol 11/23/2012    Deniece Ree PT, DPT [place] [address] [place], [place], [address] Phone: [phone]   Fax:  [phone]  Name: Karina Kirk MRN: [id number] Date of Birth: May 14, 1955

## 2017-01-20 ENCOUNTER — Ambulatory Visit (HOSPITAL_COMMUNITY): Payer: BLUE CROSS/BLUE SHIELD

## 2017-01-20 ENCOUNTER — Encounter (HOSPITAL_COMMUNITY): Payer: Self-pay

## 2017-01-20 DIAGNOSIS — M25611 Stiffness of right shoulder, not elsewhere classified: Secondary | ICD-10-CM

## 2017-01-20 DIAGNOSIS — G8929 Other chronic pain: Secondary | ICD-10-CM

## 2017-01-20 DIAGNOSIS — M25511 Pain in right shoulder: Secondary | ICD-10-CM | POA: Diagnosis not present

## 2017-01-20 DIAGNOSIS — R293 Abnormal posture: Secondary | ICD-10-CM

## 2017-01-20 DIAGNOSIS — M6281 Muscle weakness (generalized): Secondary | ICD-10-CM

## 2017-01-20 NOTE — Therapy (Signed)
[place], [place], [address] Phone: [phone]   Fax:  [phone]  Physical Therapy Treatment  Patient Details  Name: Karina Kirk MRN: [id number] Date of Birth: 09/22/1955 Referring Provider: Sydnee Cabal  Encounter Date: 01/20/2017      PT End of Session - 01/20/17 0912    Visit Number 18   Number of Visits 24   Date for PT Re-Evaluation 02/10/17   Authorization Type BCBS Other    Authorization Time Period 11/20/16 to 01/20/17; new 01/13/17 to 02/13/17   PT Start Time 0906   PT Stop Time 0944   PT Time Calculation (min) 38 min   Activity Tolerance Patient tolerated treatment well;Patient limited by fatigue   Behavior During Therapy [facility] for tasks assessed/performed      Past Medical History:  Diagnosis Date  . Diabetes mellitus without complication ([place])   . Elevated cholesterol   . Hypertension   . Sebaceous cyst   . Vaginal discharge 06/22/2015    Past Surgical History:  Procedure Laterality Date  . ABDOMINAL HYSTERECTOMY      There were no vitals filed for this visit.      Subjective Assessment - 01/20/17 0905    Subjective Pt stated she is doing good, no reports of pain.  Continues to complete HEP daily   Pertinent History DM, HTN    Patient Stated Goals get out of the sling and get back to moving well    Currently in Pain? No/denies            [facility] PT Assessment - 01/20/17 0001      Assessment   Medical Diagnosis complete R RTC tear    Referring Provider Sydnee Cabal   Onset Date/Surgical Date 11/13/16   Hand Dominance Right   Next MD Visit Dr. Theda Sers November 28th     Prior Therapy PT for her back and shoulder earlier this year      Precautions   Precautions Shoulder   Precaution Comments using [facility] protocol      AROM   Right Shoulder Flexion 142 Degrees  trunk compensation                     [facility] Adult PT Treatment/Exercise -  01/20/17 0001      Shoulder Exercises: Supine   Flexion AROM;Strengthening;15 reps;Weights;Right;10 reps     Shoulder Exercises: Seated   Row 10 reps;Theraband   Theraband Level (Shoulder Row) Level 2 (Red)     Shoulder Exercises: Sidelying   External Rotation AROM;Right;15 reps  2#   ABduction 15 reps     Shoulder Exercises: Standing   Flexion AAROM;Right;Strengthening;20 reps;Other (comment)  dowel assistance   Flexion Limitations with dowel    ABduction Right;Strengthening;20 reps;AAROM  dowel assistance; scapution past 90 degrees, cueing for form   ABduction Limitations with dowel    Extension 10 reps;Theraband   Theraband Level (Shoulder Extension) Level 2 (Red)     Shoulder Exercises: ROM/Strengthening   Wall Pushups 10 reps   Prot/Ret//Elev/Dep 10  mod tactile and verbal cueing   Other ROM/Strengthening Exercises wall walk 5x                  PT Short Term Goals - 01/13/17 0918      PT SHORT TERM GOAL #1   Title Patient to be compliant with correct performance of HEP, to be updated as protocol allows    Baseline 10/23- compliant  Time 1   Period Weeks   Status Achieved     PT SHORT TERM GOAL #2   Title Patient to be able to maintain correct posture at least 75% of the time without external cues in order to maintain correct mechanics and prevent pain exacerbation    Baseline 10/23- improving   Time 4   Period Weeks   Status Partially Met     PT SHORT TERM GOAL #3   Title Patient to demonstate PROM as being full on all planes (as allowed per protocol) with no pain increase in order to show improvement of condition    Baseline 10/23- 160 shoulder flexion but this is functional, otherwsie goal is met    Time 4   Period Weeks   Status Partially Met           PT Long Term Goals - 01/13/17 0919      PT LONG TERM GOAL #1   Title Patient to demonstrate AAROM as being full in all planes in order to show improvement of condition and good mobilty  of shoulder complex    Baseline 10/23- mild stiffness but remains functional    Time 8   Period Weeks   Status Partially Met     PT LONG TERM GOAL #2   Title Patient to be able to perform AROM to at least 80% of full on all planes without major compensations in order to show improvement of condition and improving functional task tolerance    Baseline 10/23- compensations present    Time 8   Period Weeks   Status On-going     PT LONG TERM GOAL #3   Title Patient to be able to perform all selfcare and dressing activities with no increase in pain in order to improve functional task performance    Baseline 10/23- DNT    Time 8   Period Weeks   Status On-going     PT LONG TERM GOAL #4   Title Patient to demonstrate MMT as being at least 4/5 in all tested R shoulder muscles in order to improve functional task performance    Baseline 10/23- ongoing    Time 8   Period Weeks   Status On-going               Plan - 01/20/17 0940    Clinical Impression Statement Session focus on shoulder mobility for AROM/AAROM PRN in gravity present position with additional RTC musculautre strengthening per protocol at 10 weeks post op.  Pt pressing well towards all activities, no reports of pain does demonstrate weakness with trunk compensation at end range.  Progressed to scapular strengthening with theraband and additonal protract/retract/elevate/depress therex with demonstration, tactile and verbal cueing.  No reports of pain through session, was limited by fatigue.   Rehab Potential Good   Clinical Impairments Affecting Rehab Potential (+) motivated to participate, moderate outcomes with PT in the past   PT Frequency 2x / week   PT Duration 4 weeks   PT Treatment/Interventions ADLs/Self Care Home Management;Biofeedback;Cryotherapy;Electrical Stimulation;Iontophoresis 96m/ml Dexamethasone;Moist Heat;Ultrasound;Gait training;DME Instruction;Stair training;Functional mobility training;Therapeutic  activities;Therapeutic exercise;Balance training;Neuromuscular re-education;Patient/family education;Manual techniques;Scar mobilization;Passive range of motion;Dry needling   PT Next Visit Plan Week 10 per protocol on 10/30; AROM, light resistance training    PT Home Exercise Plan Eval: scapular retractions, chin tucks, 3D cervical excursions, biceps AROM, wrist AROM, grip strength squeezes 10/2- passive shoulder flexion, ABD, ER stretches 10/18- supine AROM of flexion, abduction, ER  Patient will benefit from skilled therapeutic intervention in order to improve the following deficits and impairments:  Decreased skin integrity, Improper body mechanics, Decreased coordination, Decreased mobility, Decreased scar mobility, Postural dysfunction, Decreased strength, Decreased range of motion, Hypomobility, Impaired UE functional use, Impaired flexibility  Visit Diagnosis: Chronic right shoulder pain  Abnormal posture  Muscle weakness (generalized)  Stiffness of right shoulder, not elsewhere classified     Problem List Patient Active Problem List   Diagnosis Date Noted  . Inflamed sebaceous cyst left shoulder blade 04/16/2016  . Vaginal discharge 06/22/2015  . Hypertension 11/23/2012  . Diabetes ([place]) 11/23/2012  . Elevated cholesterol 11/23/2012   Ihor Austin, LPTA; [place]  Aldona Lento 01/20/2017, 9:52 AM  [place] [address] [place], [place], [address] Phone: [phone]   Fax:  [phone]  Name: BLENDA WISECUP MRN: [id number] Date of Birth: 1956/01/09

## 2017-01-22 ENCOUNTER — Encounter (HOSPITAL_COMMUNITY): Payer: Self-pay | Admitting: Physical Therapy

## 2017-01-22 ENCOUNTER — Ambulatory Visit (HOSPITAL_COMMUNITY): Payer: BLUE CROSS/BLUE SHIELD | Attending: Specialist | Admitting: Physical Therapy

## 2017-01-22 DIAGNOSIS — G8929 Other chronic pain: Secondary | ICD-10-CM | POA: Diagnosis not present

## 2017-01-22 DIAGNOSIS — R293 Abnormal posture: Secondary | ICD-10-CM | POA: Insufficient documentation

## 2017-01-22 DIAGNOSIS — M6281 Muscle weakness (generalized): Secondary | ICD-10-CM | POA: Insufficient documentation

## 2017-01-22 DIAGNOSIS — M25511 Pain in right shoulder: Secondary | ICD-10-CM | POA: Insufficient documentation

## 2017-01-22 DIAGNOSIS — M25611 Stiffness of right shoulder, not elsewhere classified: Secondary | ICD-10-CM | POA: Diagnosis not present

## 2017-01-22 NOTE — Patient Instructions (Addendum)
   Supine Shoulder Flexion with band  Start by laying on your back with a light to moderate resistance band securely attached on your foot, or to the foot of the bed.    Shoulder Flexion (arm straight overhead) 1. Grab the other end of the band and in a controlled pace, lift the arm from the side of your hip until it is straight overhead.  Raise the arm overhead to the point of tension, stop before it becomes painful. The goal is to be able to touch the table overhead.   Repeat 10 times, twice a day.     SHOULDER ABDUCTION  While laying on your back, and using the red resistance band, move your right arm up and out to the side as shown in the picture above.  It should feel like you are trying to make a snow angel with your right arm. Make sure that your thumb is up towards your head.  Repeat 10 times with red band, twice a day.     Supine humeral external rotation  Start laying on your back with a towel roll(s) near your elbow joint, to keep the upper arm in the same plane as the shoulder joint.    You should be holding onto a red band for resistance for this exercise.   External Rotation: Rotate your arm so the back of your hand (knuckle side) moves toward the floor.  Only go as far as you can without moving your shoulder from the towel.  Bring your arm back to the starting position.  Repeat 10 times with red band, twice a day.

## 2017-01-22 NOTE — Therapy (Signed)
[place], [place], [address] Phone: [phone]   Fax:  [phone]  Physical Therapy Treatment  Patient Details  Name: Karina Kirk MRN: [id number] Date of Birth: 22-Feb-1956 Referring Provider: Sydnee Cabal  Encounter Date: 01/22/2017      PT End of Session - 01/22/17 1007    Visit Number 19   Number of Visits 24   Date for PT Re-Evaluation 02/10/17   Authorization Type BCBS Other    Authorization Time Period 11/20/16 to 01/20/17; new 01/13/17 to 02/13/17   PT Start Time 0923   PT Stop Time 1001   PT Time Calculation (min) 38 min   Activity Tolerance Patient tolerated treatment well;Patient limited by fatigue   Behavior During Therapy [facility] for tasks assessed/performed      Past Medical History:  Diagnosis Date  . Diabetes mellitus without complication ([place])   . Elevated cholesterol   . Hypertension   . Sebaceous cyst   . Vaginal discharge 06/22/2015    Past Surgical History:  Procedure Laterality Date  . ABDOMINAL HYSTERECTOMY      There were no vitals filed for this visit.      Subjective Assessment - 01/22/17 0923    Subjective Patient arrives stating she had a good workout last time, HEP is still going well    Pertinent History DM, HTN    Patient Stated Goals get out of the sling and get back to moving well    Currently in Pain? No/denies                         [facility] Adult PT Treatment/Exercise - 01/22/17 0001      Shoulder Exercises: Supine   Flexion Strengthening;15 reps  3x15 with 2#, 3#, then 4#    ABduction Strengthening;15 reps;Weights  2x15 c 2#, 1 x15 with 1#    Other Supine Exercises review of shoulder flexion and ABD with red TB (HEP review)     Shoulder Exercises: Sidelying   External Rotation Strengthening;Right;15 reps;Weights  2x15 with 2#    Other Sidelying Exercises supine ER with red TB x10 (HEP review)     Shoulder Exercises: Standing   Flexion  AAROM;Right;20 reps;Strengthening   Flexion Limitations dowel    ABduction Right;Strengthening;AAROM;20 reps   ABduction Limitations with dowel    Other Standing Exercises wall ball circles 2x15 CW and CCW    Other Standing Exercises 1/2 of ball alphabet before fatiguing out                 PT Education - 01/22/17 1007    Education provided Yes   Education Details HEP updates, multiple demonstrations of correct form    Person(s) Educated Patient   Methods Explanation;Demonstration   Comprehension Verbalized understanding          PT Short Term Goals - 01/13/17 0918      PT SHORT TERM GOAL #1   Title Patient to be compliant with correct performance of HEP, to be updated as protocol allows    Baseline 10/23- compliant    Time 1   Period Weeks   Status Achieved     PT SHORT TERM GOAL #2   Title Patient to be able to maintain correct posture at least 75% of the time without external cues in order to maintain correct mechanics and prevent pain exacerbation    Baseline 10/23- improving   Time 4   Period Weeks  Status Partially Met     PT SHORT TERM GOAL #3   Title Patient to demonstate PROM as being full on all planes (as allowed per protocol) with no pain increase in order to show improvement of condition    Baseline 10/23- 160 shoulder flexion but this is functional, otherwsie goal is met    Time 4   Period Weeks   Status Partially Met           PT Long Term Goals - 01/13/17 0919      PT LONG TERM GOAL #1   Title Patient to demonstrate AAROM as being full in all planes in order to show improvement of condition and good mobilty of shoulder complex    Baseline 10/23- mild stiffness but remains functional    Time 8   Period Weeks   Status Partially Met     PT LONG TERM GOAL #2   Title Patient to be able to perform AROM to at least 80% of full on all planes without major compensations in order to show improvement of condition and improving functional task  tolerance    Baseline 10/23- compensations present    Time 8   Period Weeks   Status On-going     PT LONG TERM GOAL #3   Title Patient to be able to perform all selfcare and dressing activities with no increase in pain in order to improve functional task performance    Baseline 10/23- DNT    Time 8   Period Weeks   Status On-going     PT LONG TERM GOAL #4   Title Patient to demonstrate MMT as being at least 4/5 in all tested R shoulder muscles in order to improve functional task performance    Baseline 10/23- ongoing    Time 8   Period Weeks   Status On-going               Plan - 01/22/17 1008    Clinical Impression Statement Continued working on functional strengthening this session, primarily in gravity eliminated positions due to ongoing weakness that prevents patient from achieving full range compensation free in gravity-present positions. Strength continues to be primary deficit as patient is able to achieve full ROM with gravity eliminated and with dowel assist in standing. Updated HEP to assist in strength gain this session as well. Patient remains motivated moving forward.    Rehab Potential Good   Clinical Impairments Affecting Rehab Potential (+) motivated to participate, moderate outcomes with PT in the past   PT Frequency 2x / week   PT Duration 4 weeks   PT Treatment/Interventions ADLs/Self Care Home Management;Biofeedback;Cryotherapy;Electrical Stimulation;Iontophoresis 4m/ml Dexamethasone;Moist Heat;Ultrasound;Gait training;DME Instruction;Stair training;Functional mobility training;Therapeutic activities;Therapeutic exercise;Balance training;Neuromuscular re-education;Patient/family education;Manual techniques;Scar mobilization;Passive range of motion;Dry needling   PT Next Visit Plan Week 10 per protocol on 10/30; AROM, light resistance training    PT Home Exercise Plan Eval: scapular retractions, chin tucks, 3D cervical excursions, biceps AROM, wrist AROM, grip  strength squeezes 10/2- passive shoulder flexion, ABD, ER stretches 10/18- supine AROM of flexion, abduction, ER 11/1- red TB supine flexion, abduction, ER      Consulted and Agree with Plan of Care Patient      Patient will benefit from skilled therapeutic intervention in order to improve the following deficits and impairments:  Decreased skin integrity, Improper body mechanics, Decreased coordination, Decreased mobility, Decreased scar mobility, Postural dysfunction, Decreased strength, Decreased range of motion, Hypomobility, Impaired UE functional use, Impaired flexibility  Visit Diagnosis:  Chronic right shoulder pain  Abnormal posture  Muscle weakness (generalized)  Stiffness of right shoulder, not elsewhere classified     Problem List Patient Active Problem List   Diagnosis Date Noted  . Inflamed sebaceous cyst left shoulder blade 04/16/2016  . Vaginal discharge 06/22/2015  . Hypertension 11/23/2012  . Diabetes ([place]) 11/23/2012  . Elevated cholesterol 11/23/2012    Deniece Ree PT, DPT [facility] [address] [place], [place], [address] Phone: [phone]   Fax:  [phone]  Name: OMOLARA CAROL MRN: [id number] Date of Birth: 31-Aug-1955

## 2017-01-27 ENCOUNTER — Ambulatory Visit (HOSPITAL_COMMUNITY): Payer: BLUE CROSS/BLUE SHIELD

## 2017-01-27 ENCOUNTER — Encounter (HOSPITAL_COMMUNITY): Payer: Self-pay

## 2017-01-27 DIAGNOSIS — M6281 Muscle weakness (generalized): Secondary | ICD-10-CM

## 2017-01-27 DIAGNOSIS — M25611 Stiffness of right shoulder, not elsewhere classified: Secondary | ICD-10-CM

## 2017-01-27 DIAGNOSIS — R293 Abnormal posture: Secondary | ICD-10-CM | POA: Diagnosis not present

## 2017-01-27 DIAGNOSIS — G8929 Other chronic pain: Secondary | ICD-10-CM | POA: Diagnosis not present

## 2017-01-27 DIAGNOSIS — M25511 Pain in right shoulder: Secondary | ICD-10-CM | POA: Diagnosis not present

## 2017-01-27 NOTE — Therapy (Signed)
[place], [place], [address] Phone: [phone]   Fax:  [phone]  Physical Therapy Treatment  Patient Details  Name: Karina Kirk MRN: [id number] Date of Birth: 10/05/1955 Referring Provider: Sydnee Cabal   Encounter Date: 01/27/2017  PT End of Session - 01/27/17 0915    Visit Number  20    Number of Visits  24    Date for PT Re-Evaluation  02/10/17    Authorization Type  BCBS Other     Authorization Time Period  11/20/16 to 01/20/17; new 01/13/17 to 02/13/17    PT Start Time  0903    PT Stop Time  0945    PT Time Calculation (min)  42 min    Activity Tolerance  Patient tolerated treatment well;Patient limited by fatigue    Behavior During Therapy  [facility] for tasks assessed/performed       Past Medical History:  Diagnosis Date  . Diabetes mellitus without complication ([place])   . Elevated cholesterol   . Hypertension   . Sebaceous cyst   . Vaginal discharge 06/22/2015    Past Surgical History:  Procedure Laterality Date  . ABDOMINAL HYSTERECTOMY      There were no vitals filed for this visit.  Subjective Assessment - 01/27/17 0912    Subjective  Pt stated her shoulder is feelin good today, no reports of pain.      Pertinent History  DM, HTN     Patient Stated Goals  get out of the sling and get back to moving well     Currently in Pain?  No/denies                      [facility] Adult PT Treatment/Exercise - 01/27/17 0001      Shoulder Exercises: Supine   Flexion  Strengthening;15 reps;Weights 3 sets initially with AAROM warm up, then 15reps with 3#, 4#   3 sets initially with AAROM warm up, then 15reps with 3#, 4#   Other Supine Exercises  protract/retract 4# dowel rod 15x      Shoulder Exercises: Sidelying   External Rotation  Strengthening;Right;15 reps;Weights    External Rotation Weight (lbs)  3#    ABduction  15 reps;Weights 2x 15 3# then 4#   2x 15 3# then 4#   ABduction  Weight (lbs)  3#, 4#      Shoulder Exercises: Standing   Flexion  5 reps    ABduction  Right;Strengthening;AAROM;20 reps    ABduction Limitations  with dowel     Extension  15 reps;Theraband    Theraband Level (Shoulder Extension)  Level 2 (Red)    Row  15 reps;Theraband    Theraband Level (Shoulder Row)  Level 2 (Red)               PT Short Term Goals - 01/13/17 0918      PT SHORT TERM GOAL #1   Title  Patient to be compliant with correct performance of HEP, to be updated as protocol allows     Baseline  10/23- compliant     Time  1    Period  Weeks    Status  Achieved      PT SHORT TERM GOAL #2   Title  Patient to be able to maintain correct posture at least 75% of the time without external cues in order to maintain correct mechanics and prevent pain exacerbation     Baseline  10/23- improving    Time  4    Period  Weeks    Status  Partially Met      PT SHORT TERM GOAL #3   Title  Patient to demonstate PROM as being full on all planes (as allowed per protocol) with no pain increase in order to show improvement of condition     Baseline  10/23- 160 shoulder flexion but this is functional, otherwsie goal is met     Time  4    Period  Weeks    Status  Partially Met        PT Long Term Goals - 01/13/17 0919      PT LONG TERM GOAL #1   Title  Patient to demonstrate AAROM as being full in all planes in order to show improvement of condition and good mobilty of shoulder complex     Baseline  10/23- mild stiffness but remains functional     Time  8    Period  Weeks    Status  Partially Met      PT LONG TERM GOAL #2   Title  Patient to be able to perform AROM to at least 80% of full on all planes without major compensations in order to show improvement of condition and improving functional task tolerance     Baseline  10/23- compensations present     Time  8    Period  Weeks    Status  On-going      PT LONG TERM GOAL #3   Title  Patient to be able to perform  all selfcare and dressing activities with no increase in pain in order to improve functional task performance     Baseline  10/23- DNT     Time  8    Period  Weeks    Status  On-going      PT LONG TERM GOAL #4   Title  Patient to demonstrate MMT as being at least 4/5 in all tested R shoulder muscles in order to improve functional task performance     Baseline  10/23- ongoing     Time  8    Period  Weeks    Status  On-going            Plan - 01/27/17 6440    Clinical Impression Statement  Continued session focus on UE and rotator cuff strengthening per protocol at 10 weeks.  Pt continues to demonstrate weakness in gravity-present positions, cueing to improve form with activities for maximal benefits.  Added RTC strengthening with good form/technique demonstrated.  No reports of pain, was limited by visible musculature fatigue with activites.      Rehab Potential  Good    Clinical Impairments Affecting Rehab Potential  (+) motivated to participate, moderate outcomes with PT in the past    PT Frequency  2x / week    PT Duration  4 weeks    PT Treatment/Interventions  ADLs/Self Care Home Management;Biofeedback;Cryotherapy;Electrical Stimulation;Iontophoresis 69m/ml Dexamethasone;Moist Heat;Ultrasound;Gait training;DME Instruction;Stair training;Functional mobility training;Therapeutic activities;Therapeutic exercise;Balance training;Neuromuscular re-education;Patient/family education;Manual techniques;Scar mobilization;Passive range of motion;Dry needling    PT Next Visit Plan  Week 11 per protocol on 11/06; AROM, light resistance training     PT Home Exercise Plan  Eval: scapular retractions, chin tucks, 3D cervical excursions, biceps AROM, wrist AROM, grip strength squeezes 10/2- passive shoulder flexion, ABD, ER stretches 10/18- supine AROM of flexion, abduction, ER         Patient will benefit  from skilled therapeutic intervention in order to improve the following deficits and  impairments:  Decreased skin integrity, Improper body mechanics, Decreased coordination, Decreased mobility, Decreased scar mobility, Postural dysfunction, Decreased strength, Decreased range of motion, Hypomobility, Impaired UE functional use, Impaired flexibility  Visit Diagnosis: Chronic right shoulder pain  Abnormal posture  Muscle weakness (generalized)  Stiffness of right shoulder, not elsewhere classified     Problem List Patient Active Problem List   Diagnosis Date Noted  . Inflamed sebaceous cyst left shoulder blade 04/16/2016  . Vaginal discharge 06/22/2015  . Hypertension 11/23/2012  . Diabetes ([place]) 11/23/2012  . Elevated cholesterol 11/23/2012    Ihor Austin, [place]; [place]  Aldona Lento 01/27/2017, 3:04 PM  [place] [address] [place], [place], [address] Phone: [phone]   Fax:  [phone]  Name: URI COVEY MRN: [id number] Date of Birth: 19-Aug-1955

## 2017-01-29 ENCOUNTER — Encounter (HOSPITAL_COMMUNITY): Payer: Self-pay

## 2017-01-29 ENCOUNTER — Ambulatory Visit (HOSPITAL_COMMUNITY): Payer: BLUE CROSS/BLUE SHIELD

## 2017-01-29 DIAGNOSIS — E782 Mixed hyperlipidemia: Secondary | ICD-10-CM | POA: Diagnosis not present

## 2017-01-29 DIAGNOSIS — I10 Essential (primary) hypertension: Secondary | ICD-10-CM | POA: Diagnosis not present

## 2017-01-29 DIAGNOSIS — M6281 Muscle weakness (generalized): Secondary | ICD-10-CM | POA: Diagnosis not present

## 2017-01-29 DIAGNOSIS — R293 Abnormal posture: Secondary | ICD-10-CM | POA: Diagnosis not present

## 2017-01-29 DIAGNOSIS — M25611 Stiffness of right shoulder, not elsewhere classified: Secondary | ICD-10-CM | POA: Diagnosis not present

## 2017-01-29 DIAGNOSIS — G8929 Other chronic pain: Secondary | ICD-10-CM | POA: Diagnosis not present

## 2017-01-29 DIAGNOSIS — E119 Type 2 diabetes mellitus without complications: Secondary | ICD-10-CM | POA: Diagnosis not present

## 2017-01-29 DIAGNOSIS — M25511 Pain in right shoulder: Secondary | ICD-10-CM | POA: Diagnosis not present

## 2017-01-29 NOTE — Therapy (Signed)
[place], [place], [address] Phone: [phone]   Fax:  [phone]  Physical Therapy Treatment  Patient Details  Name: Karina Kirk MRN: [id number] Date of Birth: 04/18/1955 Referring Provider: Sydnee Cabal   Encounter Date: 01/29/2017  PT End of Session - 01/29/17 0907    Visit Number  21    Number of Visits  24    Date for PT Re-Evaluation  02/10/17    Authorization Type  BCBS Other     Authorization Time Period  11/20/16 to 01/20/17; new 01/13/17 to 02/13/17    PT Start Time  0903    PT Stop Time  0942    PT Time Calculation (min)  39 min    Activity Tolerance  Patient tolerated treatment well;Patient limited by fatigue    Behavior During Therapy  [facility] for tasks assessed/performed       Past Medical History:  Diagnosis Date  . Diabetes mellitus without complication ([place])   . Elevated cholesterol   . Hypertension   . Sebaceous cyst   . Vaginal discharge 06/22/2015    Past Surgical History:  Procedure Laterality Date  . ABDOMINAL HYSTERECTOMY      There were no vitals filed for this visit.  Subjective Assessment - 01/29/17 0907    Subjective  Pt stated shoulder is feeling good today, no reports of pain.      Pertinent History  DM, HTN     Patient Stated Goals  get out of the sling and get back to moving well     Currently in Pain?  No/denies                      [facility] Adult PT Treatment/Exercise - 01/29/17 0001      Shoulder Exercises: Supine   Flexion  Strengthening;15 reps;Weights    Shoulder Flexion Weight (lbs)  PRE 15x 2, 3, 4#    Other Supine Exercises  protract/retract 4# dowel rod 15x      Shoulder Exercises: Sidelying   External Rotation  Strengthening;Right;15 reps;Weights    External Rotation Weight (lbs)  3#    ABduction  15 reps;Weights 2x 15   2x 15   ABduction Weight (lbs)  3#, 4#      Shoulder Exercises: Standing   External Rotation  15 reps;Theraband     Theraband Level (Shoulder External Rotation)  Level 2 (Red)    Internal Rotation  15 reps;Theraband    Theraband Level (Shoulder Internal Rotation)  Level 2 (Red)    Flexion  5 reps    Flexion Limitations  -- AAROM wall slides   AAROM wall slides   ABduction  AAROM;5 reps    ABduction Limitations  with wall slides    Extension  15 reps;Theraband    Theraband Level (Shoulder Extension)  Level 2 (Red)    Row  15 reps;Theraband    Theraband Level (Shoulder Row)  Level 2 (Red)    Other Standing Exercises  wall ball circles x 1 min prior fatigue      Shoulder Exercises: ROM/Strengthening   Prot/Ret//Elev/Dep  10 mod cueing   mod cueing              PT Short Term Goals - 01/13/17 7824      PT SHORT TERM GOAL #1   Title  Patient to be compliant with correct performance of HEP, to be updated as protocol allows     Baseline  10/23- compliant     Time  1    Period  Weeks    Status  Achieved      PT SHORT TERM GOAL #2   Title  Patient to be able to maintain correct posture at least 75% of the time without external cues in order to maintain correct mechanics and prevent pain exacerbation     Baseline  10/23- improving    Time  4    Period  Weeks    Status  Partially Met      PT SHORT TERM GOAL #3   Title  Patient to demonstate PROM as being full on all planes (as allowed per protocol) with no pain increase in order to show improvement of condition     Baseline  10/23- 160 shoulder flexion but this is functional, otherwsie goal is met     Time  4    Period  Weeks    Status  Partially Met        PT Long Term Goals - 01/13/17 0919      PT LONG TERM GOAL #1   Title  Patient to demonstrate AAROM as being full in all planes in order to show improvement of condition and good mobilty of shoulder complex     Baseline  10/23- mild stiffness but remains functional     Time  8    Period  Weeks    Status  Partially Met      PT LONG TERM GOAL #2   Title  Patient to be able to  perform AROM to at least 80% of full on all planes without major compensations in order to show improvement of condition and improving functional task tolerance     Baseline  10/23- compensations present     Time  8    Period  Weeks    Status  On-going      PT LONG TERM GOAL #3   Title  Patient to be able to perform all selfcare and dressing activities with no increase in pain in order to improve functional task performance     Baseline  10/23- DNT     Time  8    Period  Weeks    Status  On-going      PT LONG TERM GOAL #4   Title  Patient to demonstrate MMT as being at least 4/5 in all tested R shoulder muscles in order to improve functional task performance     Baseline  10/23- ongoing     Time  8    Period  Weeks    Status  On-going            Plan - 01/29/17 4235    Clinical Impression Statement  Session focus on UE and rotator cuff strengthening per protocol at 11 weeks.  Pt continues to demonstrate muscle fatigue with gravity resistance positions.  Added IR/ER strengthening with theraband resistance for RTC strengthening.  Cueing for elbow extension with shoulder flexion strengthening exercises through session.  No reoprts of pain, was limited by fatigue with tasks.  Reports she gave blood this morning so a little drained with tasks, water and snacks were offered declined.    Rehab Potential  Good    Clinical Impairments Affecting Rehab Potential  (+) motivated to participate, moderate outcomes with PT in the past    PT Frequency  2x / week    PT Duration  4 weeks    PT Treatment/Interventions  ADLs/Self Care  Home Management;Biofeedback;Cryotherapy;Electrical Stimulation;Iontophoresis 71m/ml Dexamethasone;Moist Heat;Ultrasound;Gait training;DME Instruction;Stair training;Functional mobility training;Therapeutic activities;Therapeutic exercise;Balance training;Neuromuscular re-education;Patient/family education;Manual techniques;Scar mobilization;Passive range of motion;Dry  needling    PT Next Visit Plan  Week 11 per protocol on 11/06; AROM, light resistance training     PT Home Exercise Plan  Eval: scapular retractions, chin tucks, 3D cervical excursions, biceps AROM, wrist AROM, grip strength squeezes 10/2- passive shoulder flexion, ABD, ER stretches 10/18- supine AROM of flexion, abduction, ER         Patient will benefit from skilled therapeutic intervention in order to improve the following deficits and impairments:  Decreased skin integrity, Improper body mechanics, Decreased coordination, Decreased mobility, Decreased scar mobility, Postural dysfunction, Decreased strength, Decreased range of motion, Hypomobility, Impaired UE functional use, Impaired flexibility  Visit Diagnosis: Chronic right shoulder pain  Abnormal posture  Muscle weakness (generalized)  Stiffness of right shoulder, not elsewhere classified     Problem List Patient Active Problem List   Diagnosis Date Noted  . Inflamed sebaceous cyst left shoulder blade 04/16/2016  . Vaginal discharge 06/22/2015  . Hypertension 11/23/2012  . Diabetes ([place] 11/23/2012  . Elevated cholesterol 11/23/2012   CIhor Austin LPTA; COdessa CAldona Lento11/10/2016, 9:44 AM  [address] [place] [address]Phone: [phone]  Fax:  [phone] Name: WMALYIA MOROMRN: [id number] of Birth: 911/01/1956

## 2017-02-02 DIAGNOSIS — E1165 Type 2 diabetes mellitus with hyperglycemia: Secondary | ICD-10-CM | POA: Diagnosis not present

## 2017-02-03 ENCOUNTER — Other Ambulatory Visit: Payer: Self-pay

## 2017-02-03 ENCOUNTER — Encounter (HOSPITAL_COMMUNITY): Payer: Self-pay | Admitting: Physical Therapy

## 2017-02-03 ENCOUNTER — Ambulatory Visit (HOSPITAL_COMMUNITY): Payer: BLUE CROSS/BLUE SHIELD | Admitting: Physical Therapy

## 2017-02-03 DIAGNOSIS — G8929 Other chronic pain: Secondary | ICD-10-CM | POA: Diagnosis not present

## 2017-02-03 DIAGNOSIS — M25511 Pain in right shoulder: Secondary | ICD-10-CM | POA: Diagnosis not present

## 2017-02-03 DIAGNOSIS — R293 Abnormal posture: Secondary | ICD-10-CM

## 2017-02-03 DIAGNOSIS — M25611 Stiffness of right shoulder, not elsewhere classified: Secondary | ICD-10-CM | POA: Diagnosis not present

## 2017-02-03 DIAGNOSIS — M6281 Muscle weakness (generalized): Secondary | ICD-10-CM

## 2017-02-03 NOTE — Therapy (Signed)
[place], [place], [address] Phone: [phone]   Fax:  [phone]  Physical Therapy Treatment  Patient Details  Name: Karina Kirk MRN: [id number] Date of Birth: 1955-09-14 Referring Provider: Sydnee Cabal   Encounter Date: 02/03/2017  PT End of Session - 02/03/17 0948    Visit Number  22    Number of Visits  24    Date for PT Re-Evaluation  02/10/17    Authorization Type  BCBS Other     Authorization Time Period  11/20/16 to 01/20/17; new 01/13/17 to 02/13/17    PT Start Time  0905    PT Stop Time  0946    PT Time Calculation (min)  41 min    Activity Tolerance  Patient tolerated treatment well;Patient limited by fatigue    Behavior During Therapy  [facility] for tasks assessed/performed       Past Medical History:  Diagnosis Date  . Diabetes mellitus without complication ([place])   . Elevated cholesterol   . Hypertension   . Sebaceous cyst   . Vaginal discharge 06/22/2015    Past Surgical History:  Procedure Laterality Date  . ABDOMINAL HYSTERECTOMY      There were no vitals filed for this visit.  Subjective Assessment - 02/03/17 0907    Subjective  Pt states that she is working on her HEP; she is not having any difficulty doing anything at home.     Pertinent History  DM, HTN     Patient Stated Goals  get out of the sling and get back to moving well     Currently in Pain?  No/denies                      [facility] Adult PT Treatment/Exercise - 02/03/17 0001      Exercises   Exercises  Shoulder      Shoulder Exercises: Supine   Other Supine Exercises  PROM for all       Shoulder Exercises: Seated   External Rotation Weight (lbs)  2    External Rotation Limitations  10    Flexion  Both;10 reps    Flexion Limitations  as far as possible on own then AA    ABduction Limitations  10x AA without substitution    Other Seated Exercises  punch downs for scapular depressions.       Shoulder Exercises: Prone   Retraction  Strengthening;Both;10 reps    Retraction Limitations  2    Extension Weight (lbs)  2    Extension Limitations  10    Horizontal ABduction 1  Right;5 reps      Shoulder Exercises: Sidelying   External Rotation  Strengthening;Right;15 reps;Weights    External Rotation Weight (lbs)  2#    ABduction  Right;10 reps    ABduction Weight (lbs)  --      Manual Therapy   Manual Therapy  Joint mobilization    Joint Mobilization  Posterior, inferior and anterior glides     Passive ROM  All motions               PT Short Term Goals - 01/13/17 0109      PT SHORT TERM GOAL #1   Title  Patient to be compliant with correct performance of HEP, to be updated as protocol allows     Baseline  10/23- compliant     Time  1    Period  Weeks  Status  Achieved      PT SHORT TERM GOAL #2   Title  Patient to be able to maintain correct posture at least 75% of the time without external cues in order to maintain correct mechanics and prevent pain exacerbation     Baseline  10/23- improving    Time  4    Period  Weeks    Status  Partially Met      PT SHORT TERM GOAL #3   Title  Patient to demonstate PROM as being full on all planes (as allowed per protocol) with no pain increase in order to show improvement of condition     Baseline  10/23- 160 shoulder flexion but this is functional, otherwsie goal is met     Time  4    Period  Weeks    Status  Partially Met        PT Long Term Goals - 01/13/17 0919      PT LONG TERM GOAL #1   Title  Patient to demonstrate AAROM as being full in all planes in order to show improvement of condition and good mobilty of shoulder complex     Baseline  10/23- mild stiffness but remains functional     Time  8    Period  Weeks    Status  Partially Met      PT LONG TERM GOAL #2   Title  Patient to be able to perform AROM to at least 80% of full on all planes without major compensations in order to show improvement  of condition and improving functional task tolerance     Baseline  10/23- compensations present     Time  8    Period  Weeks    Status  On-going      PT LONG TERM GOAL #3   Title  Patient to be able to perform all selfcare and dressing activities with no increase in pain in order to improve functional task performance     Baseline  10/23- DNT     Time  8    Period  Weeks    Status  On-going      PT LONG TERM GOAL #4   Title  Patient to demonstrate MMT as being at least 4/5 in all tested R shoulder muscles in order to improve functional task performance     Baseline  10/23- ongoing     Time  8    Period  Weeks    Status  On-going            Plan - 02/03/17 1035    Clinical Impression Statement  PT has significant substitusions with AROM with scapular elevation with flexion and abduction.  Significant amount of time spent on PROM with therapist blocking elevation and joint mobilizations.  PT instructed in scapular depression exercises and instructed to complete 30 per day      Rehab Potential  Good    Clinical Impairments Affecting Rehab Potential  (+) motivated to participate, moderate outcomes with PT in the past    PT Frequency  2x / week    PT Duration  4 weeks    PT Treatment/Interventions  ADLs/Self Care Home Management;Biofeedback;Cryotherapy;Electrical Stimulation;Iontophoresis 87m/ml Dexamethasone;Moist Heat;Ultrasound;Gait training;DME Instruction;Stair training;Functional mobility training;Therapeutic activities;Therapeutic exercise;Balance training;Neuromuscular re-education;Patient/family education;Manual techniques;Scar mobilization;Passive range of motion;Dry needling    PT Next Visit Plan  Week 11 per protocol on 11/06; AROM, light resistance training; Complete joint mobilzation avoid substitutional patterns.  PT Home Exercise Plan  Eval: scapular retractions, chin tucks, 3D cervical excursions, biceps AROM, wrist AROM, grip strength squeezes 10/2- passive shoulder  flexion, ABD, ER stretches 10/18- supine AROM of flexion, abduction, ER         Patient will benefit from skilled therapeutic intervention in order to improve the following deficits and impairments:  Decreased skin integrity, Improper body mechanics, Decreased coordination, Decreased mobility, Decreased scar mobility, Postural dysfunction, Decreased strength, Decreased range of motion, Hypomobility, Impaired UE functional use, Impaired flexibility  Visit Diagnosis: Chronic right shoulder pain  Abnormal posture  Muscle weakness (generalized)  Stiffness of right shoulder, not elsewhere classified     Problem List Patient Active Problem List   Diagnosis Date Noted  . Inflamed sebaceous cyst left shoulder blade 04/16/2016  . Vaginal discharge 06/22/2015  . Hypertension 11/23/2012  . Diabetes ([place]) 11/23/2012  . Elevated cholesterol 11/23/2012    Rayetta Humphrey, PT CLT [phone] 02/03/2017, 10:41 AM  [facility] [address] [place], [place], [address] Phone: [phone]   Fax:  [phone]  Name: Karina Kirk MRN: [id number] Date of Birth: [birth year]

## 2017-02-05 ENCOUNTER — Encounter (HOSPITAL_COMMUNITY): Payer: Self-pay

## 2017-02-05 ENCOUNTER — Ambulatory Visit (HOSPITAL_COMMUNITY): Payer: BLUE CROSS/BLUE SHIELD

## 2017-02-05 DIAGNOSIS — M25511 Pain in right shoulder: Principal | ICD-10-CM

## 2017-02-05 DIAGNOSIS — R293 Abnormal posture: Secondary | ICD-10-CM

## 2017-02-05 DIAGNOSIS — M25611 Stiffness of right shoulder, not elsewhere classified: Secondary | ICD-10-CM

## 2017-02-05 DIAGNOSIS — G8929 Other chronic pain: Secondary | ICD-10-CM | POA: Diagnosis not present

## 2017-02-05 DIAGNOSIS — M6281 Muscle weakness (generalized): Secondary | ICD-10-CM

## 2017-02-05 NOTE — Therapy (Signed)
[place], [place], [address] Phone: [phone]   Fax:  [phone]  Physical Therapy Treatment  Patient Details  Name: Karina Kirk MRN: [id number] Date of Birth: [birth year] Referring Provider: Sydnee Cabal   Encounter Date: 02/05/2017  PT End of Session - 02/05/17 0903    Visit Number  23    Number of Visits  24    Date for PT Re-Evaluation  02/10/17    Authorization Type  BCBS Other     Authorization Time Period  11/20/16 to 01/20/17; new 01/13/17 to 02/13/17    PT Start Time  0858    PT Stop Time  0944    PT Time Calculation (min)  46 min    Activity Tolerance  Patient tolerated treatment well;Patient limited by fatigue    Behavior During Therapy  [facility] for tasks assessed/performed       Past Medical History:  Diagnosis Date  . Diabetes mellitus without complication ([place])   . Elevated cholesterol   . Hypertension   . Sebaceous cyst   . Vaginal discharge 06/22/2015    Past Surgical History:  Procedure Laterality Date  . ABDOMINAL HYSTERECTOMY      There were no vitals filed for this visit.  Subjective Assessment - 02/05/17 0902    Subjective  Pt stated she is feeling good today, no reports of pain today.      Patient Stated Goals  get out of the sling and get back to moving well     Currently in Pain?  No/denies                      [facility] Adult PT Treatment/Exercise - 02/05/17 0001      Shoulder Exercises: Supine   Other Supine Exercises  PROM for all       Shoulder Exercises: Seated   Flexion  Both;10 reps    Flexion Limitations  as far as possible on own then AA    ABduction Limitations  10x AA without substitution    Other Seated Exercises  wback 15x      Shoulder Exercises: Prone   Retraction  Strengthening;Both;10 reps    Retraction Limitations  2    Extension Weight (lbs)  2    Extension Limitations  10    Horizontal ABduction 1  Right;5 reps      Shoulder  Exercises: Sidelying   External Rotation  Strengthening;Right;15 reps;Weights    External Rotation Weight (lbs)  2#    ABduction  15 reps;Weights    ABduction Weight (lbs)  2      Shoulder Exercises: Pulleys   Flexion  2 minutes    Flexion Limitations  cueing to reduce compensations    ABduction  Limitations    ABduction Limitations  15x, cueing to reduce compensation; lumbar roll and cueing for posture      Manual Therapy   Manual Therapy  Joint mobilization;Passive ROM    Joint Mobilization  Posterior, inferior and anterior glides     Passive ROM  All motions               PT Short Term Goals - 01/13/17 5597      PT SHORT TERM GOAL #1   Title  Patient to be compliant with correct performance of HEP, to be updated as protocol allows     Baseline  10/23- compliant     Time  1    Period  Weeks    Status  Achieved      PT SHORT TERM GOAL #2   Title  Patient to be able to maintain correct posture at least 75% of the time without external cues in order to maintain correct mechanics and prevent pain exacerbation     Baseline  10/23- improving    Time  4    Period  Weeks    Status  Partially Met      PT SHORT TERM GOAL #3   Title  Patient to demonstate PROM as being full on all planes (as allowed per protocol) with no pain increase in order to show improvement of condition     Baseline  10/23- 160 shoulder flexion but this is functional, otherwsie goal is met     Time  4    Period  Weeks    Status  Partially Met        PT Long Term Goals - 01/13/17 0919      PT LONG TERM GOAL #1   Title  Patient to demonstrate AAROM as being full in all planes in order to show improvement of condition and good mobilty of shoulder complex     Baseline  10/23- mild stiffness but remains functional     Time  8    Period  Weeks    Status  Partially Met      PT LONG TERM GOAL #2   Title  Patient to be able to perform AROM to at least 80% of full on all planes without major  compensations in order to show improvement of condition and improving functional task tolerance     Baseline  10/23- compensations present     Time  8    Period  Weeks    Status  On-going      PT LONG TERM GOAL #3   Title  Patient to be able to perform all selfcare and dressing activities with no increase in pain in order to improve functional task performance     Baseline  10/23- DNT     Time  8    Period  Weeks    Status  On-going      PT LONG TERM GOAL #4   Title  Patient to demonstrate MMT as being at least 4/5 in all tested R shoulder muscles in order to improve functional task performance     Baseline  10/23- ongoing     Time  8    Period  Weeks    Status  On-going            Plan - 02/05/17 0930    Clinical Impression Statement  Continues to present with significant substituation with AROM with scapular elevation with flexion and abduction.  Therapist facilitaiton with verbal, visual imput and tactile cueing to address compensation.  PROM with scapular mobs to improve range and blocking elevation for proper muscle activation.    Rehab Potential  Good    Clinical Impairments Affecting Rehab Potential  (+) motivated to participate, moderate outcomes with PT in the past    PT Frequency  2x / week    PT Duration  4 weeks    PT Treatment/Interventions  ADLs/Self Care Home Management;Biofeedback;Cryotherapy;Electrical Stimulation;Iontophoresis 86m/ml Dexamethasone;Moist Heat;Ultrasound;Gait training;DME Instruction;Stair training;Functional mobility training;Therapeutic activities;Therapeutic exercise;Balance training;Neuromuscular re-education;Patient/family education;Manual techniques;Scar mobilization;Passive range of motion;Dry needling    PT Next Visit Plan  Reassess next session.  Week 12 per protocol on 11/13; AROM, light resistance training; Complete joint mobilzation  avoid substitutional patterns.     PT Home Exercise Plan  Eval: scapular retractions, chin tucks, 3D  cervical excursions, biceps AROM, wrist AROM, grip strength squeezes 10/2- passive shoulder flexion, ABD, ER stretches 10/18- supine AROM of flexion, abduction, ER         Patient will benefit from skilled therapeutic intervention in order to improve the following deficits and impairments:  Decreased skin integrity, Improper body mechanics, Decreased coordination, Decreased mobility, Decreased scar mobility, Postural dysfunction, Decreased strength, Decreased range of motion, Hypomobility, Impaired UE functional use, Impaired flexibility  Visit Diagnosis: Chronic right shoulder pain  Abnormal posture  Muscle weakness (generalized)  Stiffness of right shoulder, not elsewhere classified     Problem List Patient Active Problem List   Diagnosis Date Noted  . Inflamed sebaceous cyst left shoulder blade 04/16/2016  . Vaginal discharge 06/22/2015  . Hypertension 11/23/2012  . Diabetes ([place]) 11/23/2012  . Elevated cholesterol 11/23/2012   Ihor Austin, LPTA; [place]  Aldona Lento 02/05/2017, 9:46 AM  [place], [place], [address] Phone: [phone]   Fax:  [phone]  Name: BRITANNY MARKSBERRY MRN: [id number] Date of Birth: 1955-09-25

## 2017-02-10 ENCOUNTER — Ambulatory Visit (HOSPITAL_COMMUNITY): Payer: BLUE CROSS/BLUE SHIELD | Admitting: Physical Therapy

## 2017-02-10 ENCOUNTER — Other Ambulatory Visit: Payer: Self-pay

## 2017-02-10 ENCOUNTER — Encounter (HOSPITAL_COMMUNITY): Payer: Self-pay | Admitting: Physical Therapy

## 2017-02-10 DIAGNOSIS — M6281 Muscle weakness (generalized): Secondary | ICD-10-CM

## 2017-02-10 DIAGNOSIS — M25511 Pain in right shoulder: Secondary | ICD-10-CM | POA: Diagnosis not present

## 2017-02-10 DIAGNOSIS — R293 Abnormal posture: Secondary | ICD-10-CM

## 2017-02-10 DIAGNOSIS — G8929 Other chronic pain: Secondary | ICD-10-CM | POA: Diagnosis not present

## 2017-02-10 DIAGNOSIS — M25611 Stiffness of right shoulder, not elsewhere classified: Secondary | ICD-10-CM

## 2017-02-10 NOTE — Therapy (Signed)
[place], [place], [address] Phone: [phone]   Fax:  [phone]  Physical Therapy Treatment/Re-assessment  Patient Details  Name: Karina Kirk MRN: [id number] Date of Birth: September 16, 1955 Referring Provider: Sydnee Cabal   Encounter Date: 02/10/2017  PT End of Session - 02/10/17 0859    Visit Number  24    Number of Visits  24    Date for PT Re-Evaluation  02/10/17    Authorization Type  BCBS Other     Authorization Time Period  11/20/16 to 01/20/17; new 01/13/17 to 02/13/17    PT Start Time  0900    PT Stop Time  0943    PT Time Calculation (min)  43 min    Activity Tolerance  Patient tolerated treatment well    Behavior During Therapy  [facility] for tasks assessed/performed       Past Medical History:  Diagnosis Date  . Diabetes mellitus without complication ([place])   . Elevated cholesterol   . Hypertension   . Sebaceous cyst   . Vaginal discharge 06/22/2015    Past Surgical History:  Procedure Laterality Date  . ABDOMINAL HYSTERECTOMY      There were no vitals filed for this visit.  Subjective Assessment - 02/10/17 0858    Subjective  Pateitn states her exercises are going. SH estaets she does them every day, Patient states all her exercises are pretty easy and she is not having trouble with activies at home.  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 has been raking and listign at home and hasn;t been bothering it. vaccuming easy.    Pertinent History  DM, HTN     Patient Stated Goals  get out of the sling and get back to moving well     Currently in Pain?  No/denies         [facility] PT Assessment - 02/10/17 0001      AROM   Right Shoulder Flexion  164 Degrees AAROM 171*    Right Shoulder ABduction  155 Degrees      PROM   Right Shoulder Flexion  175 Degrees    Right Shoulder ABduction  160 Degrees      Strength   Right  Shoulder Flexion  4/5 at 90 *    Right Shoulder ABduction  4/5 at 45*    Right Shoulder Internal Rotation  4/5 at 90*, 4+/5 at 0*    Right Shoulder External Rotation  4/5 at 90*, 4+/5 at 0[facility]/Exercise - 02/10/17 0001      Shoulder Exercises: Supine   Other Supine Exercises  AAROM right shoulder flexion with cane, 10 reps    Other Supine Exercises  PROM for all       Shoulder Exercises: Standing   Row  AROM;Strengthening;15 reps;Theraband    Theraband Level (Shoulder Row)  Level 2 (Red)    Other Standing Exercises  wall push up for serratus anterior, 1x 15 reps    Other Standing Exercises  wall walks for shuolder flexion with lift off; 1x 15 reps         PT Education - 02/10/17 0948    Education provided  Yes    Education Details  Exercise form and reviewed progress towards goals and continuation of POC    Person(s) Educated  Patient  Methods  Explanation;Demonstration    Comprehension  Verbalized understanding       PT Short Term Goals - 02/10/17 0905      PT SHORT TERM GOAL #1   Title  Patient to be compliant with correct performance of HEP, to be updated as protocol allows     Baseline  10/23- compliant     Time  1    Period  Weeks    Status  Achieved      PT SHORT TERM GOAL #2   Title  Patient to be able to maintain correct posture at least 75% of the time without external cues in order to maintain correct mechanics and prevent pain exacerbation     Baseline  10/23- improving; 02/10/17 - patient feels she is maintaining proper posture roughly 75% of the time throughuot activities    Time  4    Period  Weeks    Status  Achieved      PT SHORT TERM GOAL #3   Title  Patient to demonstate PROM as being full on all planes (as allowed per protocol) with no pain increase in order to show improvement of condition     Baseline  10/23- 160 shoulder flexion but this is functional, otherwsie goal is met; 02/10/17 - patient is lacking last  10-15* of ROM inf flexion and abduction however it is functional range    Time  4    Period  Weeks    Status  Partially Met        PT Long Term Goals - 02/10/17 0908      PT LONG TERM GOAL #1   Title  Patient to demonstrate AAROM as being full in all planes in order to show improvement of condition and good mobilty of shoulder complex     Baseline  10/23- mild stiffness but remains functional; 02/10/17 - patient is lacking last 5-10* with AAROM for shoulder flex/abduction    Time  8    Period  Weeks    Status  Partially Met      PT LONG TERM GOAL #2   Title  Patient to be able to perform AROM to at least 80% of full on all planes without major compensations in order to show improvement of condition and improving functional task tolerance     Baseline  10/23- compensations present; 02/10/17 - patietn has surpassed 80% of normal ROM for shoulder flexion/abduction     Time  8    Period  Weeks    Status  Achieved      PT LONG TERM GOAL #3   Title  Patient to be able to perform all selfcare and dressing activities with no increase in pain in order to improve functional task performance     Baseline  10/23- DNT 02/10/17 - patient reports no difficulty    Time  8    Period  Weeks    Status  Achieved      PT LONG TERM GOAL #4   Title  Patient to demonstrate MMT as being at least 4/5 in all tested R shoulder muscles in order to improve functional task performance     Baseline  10/23- ongoing; 02/10/17 - patient has met 4/5 or greater wtih MMT for right shoulder    Time  8    Period  Weeks    Status  Achieved        Plan - 02/10/17 0859    Clinical Impression Statement  Patient has  met 2/3 STG's and 3/4 LTG's. She is progressing well in therapy however continues to lack the last 10-15 degrees of functional shoulder flexion/abduction. Patient continues to have poor scapular control with shoulder flexion and requires tactile/verbal cues and manual assist to facilitate proper scapular  mobility. Pateint will benefit from continued skilled PT to address ongoing impairments and progress functinoal mobility, for return to work and improved QOL. Will reduce POC to 1x/week frequency.    Rehab Potential  Good    Clinical Impairments Affecting Rehab Potential  (+) motivated to participate, moderate outcomes with PT in the past    PT Frequency  1x / week (Keep 2x/week for next weeks appointments and then drop to 1x/week after 02/20/17)   PT Duration  4 weeks    PT Treatment/Interventions  ADLs/Self Care Home Management;Biofeedback;Cryotherapy;Electrical Stimulation;Iontophoresis 37m/ml Dexamethasone;Moist Heat;Ultrasound;Gait training;DME Instruction;Stair training;Functional mobility training;Therapeutic activities;Therapeutic exercise;Balance training;Neuromuscular re-education;Patient/family education;Manual techniques;Scar mobilization;Passive range of motion;Dry needling    PT Next Visit Plan  Contniue to introduce serratus anterior stabilization exercises and mobilize [place]joint for flexion. Week 12 per protocol on 11/13; AROM, light resistance training; Complete joint mobilzation avoid substitutional patterns.     PT Home Exercise Plan  Eval: scapular retractions, chin tucks, 3D cervical excursions, biceps AROM, wrist AROM, grip strength squeezes 10/2- passive shoulder flexion, ABD, ER stretches 10/18- supine AROM of flexion, abduction, ER      Consulted and Agree with Plan of Care  Patient       Patient will benefit from skilled therapeutic intervention in order to improve the following deficits and impairments:  Decreased skin integrity, Improper body mechanics, Decreased coordination, Decreased mobility, Decreased scar mobility, Postural dysfunction, Decreased strength, Decreased range of motion, Hypomobility, Impaired UE functional use, Impaired flexibility  Visit Diagnosis: Chronic right shoulder pain  Abnormal posture  Muscle weakness (generalized)  Stiffness of right  shoulder, not elsewhere classified     Problem List Patient Active Problem List   Diagnosis Date Noted  . Inflamed sebaceous cyst left shoulder blade 04/16/2016  . Vaginal discharge 06/22/2015  . Hypertension 11/23/2012  . Diabetes ([place] 11/23/2012  . Elevated cholesterol 11/23/2012    RDebara Pickett PT, DPT Physical Therapist [facility] 02/10/2017 9:55 AM    [address]SW[place] [place] [address]Phone: [phone]  Fax:  [phone] Name: WLURETTA EVERLYMRN: [id number] of Birth: 9Jun 23, 1957

## 2017-02-16 DIAGNOSIS — S46011D Strain of muscle(s) and tendon(s) of the rotator cuff of right shoulder, subsequent encounter: Secondary | ICD-10-CM | POA: Diagnosis not present

## 2017-02-16 DIAGNOSIS — Z4789 Encounter for other orthopedic aftercare: Secondary | ICD-10-CM | POA: Diagnosis not present

## 2017-02-17 ENCOUNTER — Ambulatory Visit (HOSPITAL_COMMUNITY): Payer: BLUE CROSS/BLUE SHIELD

## 2017-02-17 ENCOUNTER — Encounter (HOSPITAL_COMMUNITY): Payer: Self-pay

## 2017-02-17 DIAGNOSIS — M25611 Stiffness of right shoulder, not elsewhere classified: Secondary | ICD-10-CM | POA: Diagnosis not present

## 2017-02-17 DIAGNOSIS — M25511 Pain in right shoulder: Principal | ICD-10-CM

## 2017-02-17 DIAGNOSIS — M6281 Muscle weakness (generalized): Secondary | ICD-10-CM

## 2017-02-17 DIAGNOSIS — G8929 Other chronic pain: Secondary | ICD-10-CM | POA: Diagnosis not present

## 2017-02-17 DIAGNOSIS — R293 Abnormal posture: Secondary | ICD-10-CM | POA: Diagnosis not present

## 2017-02-17 NOTE — Therapy (Signed)
[place], [place], [address] Phone: [phone]   Fax:  [phone]  Physical Therapy Treatment  Patient Details  Name: Karina Kirk MRN: [id number] Date of Birth: 29-Aug-1955 Referring Provider: Sydnee Cabal   Encounter Date: 02/17/2017  PT End of Session - 02/17/17 0932    Visit Number  25    Number of Visits  29    Date for PT Re-Evaluation  02/10/17    Authorization Type  BC[employer] Other     Authorization Time Period  11/20/16 to 01/20/17; new 01/13/17 to 02/13/17; 02/10/17-03/24/17    PT Start Time  0905    PT Stop Time  0949 last 4 min on UBE for scapular strengthening, not included with billing    PT Time Calculation (min)  44 min    Activity Tolerance  Patient tolerated treatment well    Behavior During Therapy  [facility] for tasks assessed/performed       Past Medical History:  Diagnosis Date  . Diabetes mellitus without complication ([place])   . Elevated cholesterol   . Hypertension   . Sebaceous cyst   . Vaginal discharge 06/22/2015    Past Surgical History:  Procedure Laterality Date  . ABDOMINAL HYSTERECTOMY      There were no vitals filed for this visit.  Subjective Assessment - 02/17/17 0905    Subjective  Pt stated shoulder is feeling good today, no reports of pain today.    Patient Stated Goals  get out of the sling and get back to moving well     Currently in Pain?  No/denies                      [facility] Adult PT Treatment/Exercise - 02/17/17 0001      Shoulder Exercises: Supine   Flexion  Strengthening;15 reps;Weights    Other Supine Exercises  AAROM right shlder flexion and abduction with cane, 10 reps      Shoulder Exercises: Seated   Other Seated Exercises  UBE backwards x 46mn L1      Shoulder Exercises: Standing   Row  AROM;Strengthening;15 reps;Theraband    Theraband Level (Shoulder Row)  Level 2 (Red)    Other Standing Exercises  wall push up for serratus anerior  punch, 1x 15 reps    Other Standing Exercises  wall walks for shuolder flexion with lift off; 1x 15 reps (cueing to reduce lumbar extension      Shoulder Exercises: Pulleys   Flexion  2 minutes    Flexion Limitations  cueing to reduce compensations    ABduction  2 minutes    ABduction Limitations  15x, cueing to reduce compensation; lumbar roll and cueing for posture      Shoulder Exercises: Therapy Ball   Flexion  15 reps    ABduction  15 reps    Right/Left  10 reps IR/ER               PT Short Term Goals - 02/10/17 0905      PT SHORT TERM GOAL #1   Title  Patient to be compliant with correct performance of HEP, to be updated as protocol allows     Baseline  10/23- compliant     Time  1    Period  Weeks    Status  Achieved      PT SHORT TERM GOAL #2   Title  Patient to be able to maintain correct posture  at least 75% of the time without external cues in order to maintain correct mechanics and prevent pain exacerbation     Baseline  10/23- improving; 02/10/17 - patient feels she is maintaining proper posture roughly 75% of the time throughuot activities    Time  4    Period  Weeks    Status  Achieved      PT SHORT TERM GOAL #3   Title  Patient to demonstate PROM as being full on all planes (as allowed per protocol) with no pain increase in order to show improvement of condition     Baseline  10/23- 160 shoulder flexion but this is functional, otherwsie goal is met; 02/10/17 - patient is lacking last 10-15* of ROM inf flexion and abduction however it is functional range    Time  4    Period  Weeks    Status  Partially Met        PT Long Term Goals - 02/10/17 0908      PT LONG TERM GOAL #1   Title  Patient to demonstrate AAROM as being full in all planes in order to show improvement of condition and good mobilty of shoulder complex     Baseline  10/23- mild stiffness but remains functional; 02/10/17 - patient is lacking last 5-10* with AAROM for shoulder  flex/abduction    Time  8    Period  Weeks    Status  Partially Met      PT LONG TERM GOAL #2   Title  Patient to be able to perform AROM to at least 80% of full on all planes without major compensations in order to show improvement of condition and improving functional task tolerance     Baseline  10/23- compensations present; 02/10/17 - patietn has surpassed 80% of normal ROM for shoulder flexion/abduction     Time  8    Period  Weeks    Status  Achieved      PT LONG TERM GOAL #3   Title  Patient to be able to perform all selfcare and dressing activities with no increase in pain in order to improve functional task performance     Baseline  10/23- DNT 02/10/17 - patient reports no difficulty    Time  8    Period  Weeks    Status  Achieved      PT LONG TERM GOAL #4   Title  Patient to demonstrate MMT as being at least 4/5 in all tested R shoulder muscles in order to improve functional task performance     Baseline  10/23- ongoing; 02/10/17 - patient has met 4/5 or greater wtih MMT for right shoulder    Time  8    Period  Weeks    Status  Achieved            Plan - 02/17/17 0160    Clinical Impression Statement  Added theraball for shoulder mobility and continued wiht scapular strengthening.  Pt continues to require cueing to reduce compensation with shoulder elevation during supine and lumbar extension with standing activities. No reports of pain through session, was limited by fatigue with activities.      Rehab Potential  Good    Clinical Impairments Affecting Rehab Potential  (+) motivated to participate, moderate outcomes with PT in the past    PT Frequency  1x / week Continue 2x/week until 11/30 then reduce to 1/week following for 4 weeks    PT Duration  4  weeks    PT Treatment/Interventions  ADLs/Self Care Home Management;Biofeedback;Cryotherapy;Electrical Stimulation;Iontophoresis 23m/ml Dexamethasone;Moist Heat;Ultrasound;Gait training;DME Instruction;Stair  training;Functional mobility training;Therapeutic activities;Therapeutic exercise;Balance training;Neuromuscular re-education;Patient/family education;Manual techniques;Scar mobilization;Passive range of motion;Dry needling    PT Next Visit Plan  Contniue to introduce serratus anterior stabilization exercises and mobilize GMcDuffiejoint for flexion. Week 12 per protocol on 11/13; AROM, light resistance training; Complete joint mobilzation avoid substitutional patterns.     PT Home Exercise Plan  Eval: scapular retractions, chin tucks, 3D cervical excursions, biceps AROM, wrist AROM, grip strength squeezes 10/2- passive shoulder flexion, ABD, ER stretches 10/18- supine AROM of flexion, abduction, ER      Consulted and Agree with Plan of Care  Patient       Patient will benefit from skilled therapeutic intervention in order to improve the following deficits and impairments:  Decreased skin integrity, Improper body mechanics, Decreased coordination, Decreased mobility, Decreased scar mobility, Postural dysfunction, Decreased strength, Decreased range of motion, Hypomobility, Impaired UE functional use, Impaired flexibility  Visit Diagnosis: Chronic right shoulder pain  Abnormal posture  Muscle weakness (generalized)  Stiffness of right shoulder, not elsewhere classified     Problem List Patient Active Problem List   Diagnosis Date Noted  . Inflamed sebaceous cyst left shoulder blade 04/16/2016  . Vaginal discharge 06/22/2015  . Hypertension 11/23/2012  . Diabetes ([place] 11/23/2012  . Elevated cholesterol 11/23/2012   CIhor Austin LTop[place] C[place] CAldona Lento11/27/2018, 10:59 AM  [place] [address]Phone: [phone]  Fax:  [phone] Name: Karina RIBERAMRN: [id number] of Birth: [birth year]

## 2017-02-19 ENCOUNTER — Encounter (HOSPITAL_COMMUNITY): Payer: Self-pay

## 2017-02-19 ENCOUNTER — Ambulatory Visit (HOSPITAL_COMMUNITY): Payer: BLUE CROSS/BLUE SHIELD

## 2017-02-19 DIAGNOSIS — G8929 Other chronic pain: Secondary | ICD-10-CM

## 2017-02-19 DIAGNOSIS — M6281 Muscle weakness (generalized): Secondary | ICD-10-CM

## 2017-02-19 DIAGNOSIS — M25511 Pain in right shoulder: Principal | ICD-10-CM

## 2017-02-19 DIAGNOSIS — M25611 Stiffness of right shoulder, not elsewhere classified: Secondary | ICD-10-CM | POA: Diagnosis not present

## 2017-02-19 DIAGNOSIS — R293 Abnormal posture: Secondary | ICD-10-CM

## 2017-02-19 NOTE — Therapy (Signed)
[place], [place], [address] Phone: [phone]   Fax:  [phone]  Physical Therapy Treatment  Patient Details  Name: Karina Kirk MRN: [id number] Date of Birth: [birth year] Referring Provider: Sydnee Cabal   Encounter Date: 02/19/2017  PT End of Session - 02/19/17 0909    Visit Number  26    Number of Visits  29    Date for PT Re-Evaluation  02/10/17    Authorization Type  BCBS Other     Authorization Time Period  11/20/16 to 01/20/17; new 01/13/17 to 02/13/17; 02/10/17-03/24/17    PT Start Time  0904    PT Stop Time  0949 EOS on bike x 4 min    PT Time Calculation (min)  45 min    Activity Tolerance  Patient tolerated treatment well    Behavior During Therapy  [facility] for tasks assessed/performed       Past Medical History:  Diagnosis Date  . Diabetes mellitus without complication ([place])   . Elevated cholesterol   . Hypertension   . Sebaceous cyst   . Vaginal discharge 06/22/2015    Past Surgical History:  Procedure Laterality Date  . ABDOMINAL HYSTERECTOMY      There were no vitals filed for this visit.  Subjective Assessment - 02/19/17 0908    Subjective  Pt stated shoulder is feeling good today, no reports of pain.    Patient Stated Goals  get out of the sling and get back to moving well     Currently in Pain?  No/denies                      [facility] Adult PT Treatment/Exercise - 02/19/17 0001      Shoulder Exercises: Seated   Flexion  Right;AROM;AAROM    Flexion Limitations  as far as possible on own then AA    Abduction  AROM;AAROM    ABduction Limitations  as far as possible on own then AA    Other Seated Exercises  UBE backwards x 70mn L1      Shoulder Exercises: Standing   Row  AROM;Strengthening;15 reps;Theraband    Theraband Level (Shoulder Row)  Level 2 (Red)    Other Standing Exercises  wall push up for serratus anerior punch, 1x 15 reps (tactile/verbal cueing for  scapular retractions    Other Standing Exercises  wall walks for shuolder flexion with lift off; 1x 15 reps (cueing to reduce lumbar extension      Shoulder Exercises: Pulleys   Flexion  2 minutes    Flexion Limitations  cueing to reduce compensations    ABduction  2 minutes    ABduction Limitations  cueing to reduce compensation; lumbar roll and cueing for posture      Shoulder Exercises: Therapy Ball   Flexion  15 reps    ABduction  15 reps    Right/Left  10 reps IR/Er      Manual Therapy   Manual Therapy  Joint mobilization;Passive ROM    Manual therapy comments  seperate from rest of session     Joint Mobilization  Posterior, inferior and anterior glides     Passive ROM  All motions               PT Short Term Goals - 02/10/17 0905      PT SHORT TERM GOAL #1   Title  Patient to be compliant with correct performance of HEP, to  be updated as protocol allows     Baseline  10/23- compliant     Time  1    Period  Weeks    Status  Achieved      PT SHORT TERM GOAL #2   Title  Patient to be able to maintain correct posture at least 75% of the time without external cues in order to maintain correct mechanics and prevent pain exacerbation     Baseline  10/23- improving; 02/10/17 - patient feels she is maintaining proper posture roughly 75% of the time throughuot activities    Time  4    Period  Weeks    Status  Achieved      PT SHORT TERM GOAL #3   Title  Patient to demonstate PROM as being full on all planes (as allowed per protocol) with no pain increase in order to show improvement of condition     Baseline  10/23- 160 shoulder flexion but this is functional, otherwsie goal is met; 02/10/17 - patient is lacking last 10-15* of ROM inf flexion and abduction however it is functional range    Time  4    Period  Weeks    Status  Partially Met        PT Long Term Goals - 02/10/17 0908      PT LONG TERM GOAL #1   Title  Patient to demonstrate AAROM as being full in  all planes in order to show improvement of condition and good mobilty of shoulder complex     Baseline  10/23- mild stiffness but remains functional; 02/10/17 - patient is lacking last 5-10* with AAROM for shoulder flex/abduction    Time  8    Period  Weeks    Status  Partially Met      PT LONG TERM GOAL #2   Title  Patient to be able to perform AROM to at least 80% of full on all planes without major compensations in order to show improvement of condition and improving functional task tolerance     Baseline  10/23- compensations present; 02/10/17 - patietn has surpassed 80% of normal ROM for shoulder flexion/abduction     Time  8    Period  Weeks    Status  Achieved      PT LONG TERM GOAL #3   Title  Patient to be able to perform all selfcare and dressing activities with no increase in pain in order to improve functional task performance     Baseline  10/23- DNT 02/10/17 - patient reports no difficulty    Time  8    Period  Weeks    Status  Achieved      PT LONG TERM GOAL #4   Title  Patient to demonstrate MMT as being at least 4/5 in all tested R shoulder muscles in order to improve functional task performance     Baseline  10/23- ongoing; 02/10/17 - patient has met 4/5 or greater wtih MMT for right shoulder    Time  8    Period  Weeks    Status  Achieved            Plan - 02/19/17 0947    Clinical Impression Statement  Session focus with shoulder mobility and scapular strengthening.  Pt does continue to require cueing to reduce compensation with shoulder elevation and lumbar extension with flexion and abduction.  Improved technqiue noted with mirror to improve awareness with movements though does continue to require verbal  cueing to reduce substitution/compensation.  No reports of pain through session.      Rehab Potential  Good    Clinical Impairments Affecting Rehab Potential  (+) motivated to participate, moderate outcomes with PT in the past    PT Frequency  1x / week  Continue 2x/week until 11/30 then reduce to 1x/week for 4 weeks    PT Treatment/Interventions  ADLs/Self Care Home Management;Biofeedback;Cryotherapy;Electrical Stimulation;Iontophoresis 42m/ml Dexamethasone;Moist Heat;Ultrasound;Gait training;DME Instruction;Stair training;Functional mobility training;Therapeutic activities;Therapeutic exercise;Balance training;Neuromuscular re-education;Patient/family education;Manual techniques;Scar mobilization;Passive range of motion;Dry needling    PT Next Visit Plan  Contniue to introduce serratus anterior stabilization exercises and mobilize GWessingtonjoint for flexion. Week 13 per protocol on 11/20; AROM, light resistance training; Complete joint mobilzation avoid substitutional patterns.     PT Home Exercise Plan  Eval: scapular retractions, chin tucks, 3D cervical excursions, biceps AROM, wrist AROM, grip strength squeezes 10/2- passive shoulder flexion, ABD, ER stretches 10/18- supine AROM of flexion, abduction, ER         Patient will benefit from skilled therapeutic intervention in order to improve the following deficits and impairments:  Decreased skin integrity, Improper body mechanics, Decreased coordination, Decreased mobility, Decreased scar mobility, Postural dysfunction, Decreased strength, Decreased range of motion, Hypomobility, Impaired UE functional use, Impaired flexibility  Visit Diagnosis: Chronic right shoulder pain  Abnormal posture  Muscle weakness (generalized)  Stiffness of right shoulder, not elsewhere classified     Problem List Patient Active Problem List   Diagnosis Date Noted  . Inflamed sebaceous cyst left shoulder blade 04/16/2016  . Vaginal discharge 06/22/2015  . Hypertension 11/23/2012  . Diabetes ([place] 11/23/2012  . Elevated cholesterol 11/23/2012   CIhor Austin LWell[place] C[place] CAldona Lento11/29/2018, 10:07 AM  [place] [address]Phone: [phone]  Fax:  [phone] Name: Karina Kirk: [id number] of Birth: 912/15/1957

## 2017-02-20 ENCOUNTER — Ambulatory Visit: Payer: BLUE CROSS/BLUE SHIELD | Admitting: Obstetrics & Gynecology

## 2017-02-24 ENCOUNTER — Ambulatory Visit (HOSPITAL_COMMUNITY): Payer: BLUE CROSS/BLUE SHIELD | Attending: Specialist

## 2017-02-24 ENCOUNTER — Encounter (HOSPITAL_COMMUNITY): Payer: Self-pay

## 2017-02-24 ENCOUNTER — Other Ambulatory Visit: Payer: Self-pay

## 2017-02-24 DIAGNOSIS — M25611 Stiffness of right shoulder, not elsewhere classified: Secondary | ICD-10-CM | POA: Diagnosis not present

## 2017-02-24 DIAGNOSIS — R293 Abnormal posture: Secondary | ICD-10-CM

## 2017-02-24 DIAGNOSIS — G8929 Other chronic pain: Secondary | ICD-10-CM

## 2017-02-24 DIAGNOSIS — M25511 Pain in right shoulder: Secondary | ICD-10-CM | POA: Insufficient documentation

## 2017-02-24 DIAGNOSIS — M6281 Muscle weakness (generalized): Secondary | ICD-10-CM | POA: Insufficient documentation

## 2017-02-24 NOTE — Therapy (Signed)
[place], [place], [address] Phone: [phone]   Fax:  [phone]  Physical Therapy Treatment  Patient Details  Name: Karina Kirk MRN: [id number] Date of Birth: [birth year] Referring Provider: Sydnee Cabal   Encounter Date: 02/24/2017  PT End of Session - 02/24/17 1653    Visit Number  27    Number of Visits  29    Date for PT Re-Evaluation  02/10/17    Authorization Type  BCBS Other     Authorization Time Period  11/20/16 to 01/20/17; new 01/13/17 to 02/13/17; 02/10/17-03/24/17    PT Start Time  1646    PT Stop Time  1728    PT Time Calculation (min)  42 min    Activity Tolerance  Patient tolerated treatment well;No increased pain    Behavior During Therapy  WFL for tasks assessed/performed       Past Medical History:  Diagnosis Date  . Diabetes mellitus without complication ([place])   . Elevated cholesterol   . Hypertension   . Sebaceous cyst   . Vaginal discharge 06/22/2015    Past Surgical History:  Procedure Laterality Date  . ABDOMINAL HYSTERECTOMY      There were no vitals filed for this visit.  Subjective Assessment - 02/24/17 1650    Subjective  Patient states she is doing well today and had no pain at work. She started back yesterday and has not had any difficulty yet. She states some of the exercises are getting easy and we may need to update her HEP.    Patient Stated Goals  get out of the sling and get back to moving well     Currently in Pain?  No/denies       [facility] Adult PT Treatment/Exercise - 02/24/17 0001      Shoulder Exercises: Seated   Retraction  20 reps    Other Seated Exercises  backwards shoulder rolls, 10x       Shoulder Exercises: Standing   External Rotation  20 reps;Theraband;Right    Theraband Level (Shoulder External Rotation)  Level 3 (Green)    Internal Rotation  Theraband;10 reps;Limitations;Right    Theraband Level (Shoulder Internal Rotation)  Level 2 (Red)     Flexion  Right    Flexion Limitations  shoulder flexion with basketball for small circles at end range for endurance, 2x 1 minute    Row  20 reps;Theraband;Strengthening;Both    Theraband Level (Shoulder Row)  Level 3 (Green)    Other Standing Exercises  wall push up for serratus anerior punch, 1x 20 reps (tactile/verbal cueing for scapular retractions    Other Standing Exercises  wall walks for shuolder flexion with lift off; 1x 15 reps (cueing to reduce lumbar extension      Shoulder Exercises: Pulleys   Flexion  2 minutes    Flexion Limitations  cueing to reduce compensations    ABduction  2 minutes    ABduction Limitations  cueing to reduce compensation; lumbar roll and cueing for posture      Shoulder Exercises: ROM/Strengthening   UBE (Upper Arm Bike)  4 minutes at level 2, retrograde      Manual Therapy   Manual Therapy  Soft tissue mobilization;Passive ROM    Manual therapy comments  seperate from rest of session     Soft tissue mobilization  myofascial release of lat dorsi at right shuolder flexion end range    Passive ROM  right shuolder flexion/abduction  PT Education - 02/24/17 1652    Education provided  Yes    Education Details  Educated on exercise form and progressing HEP.    Person(s) Educated  Patient    Methods  Explanation    Comprehension  Verbalized understanding       PT Short Term Goals - 02/10/17 0905      PT SHORT TERM GOAL #1   Title  Patient to be compliant with correct performance of HEP, to be updated as protocol allows     Baseline  10/23- compliant     Time  1    Period  Weeks    Status  Achieved      PT SHORT TERM GOAL #2   Title  Patient to be able to maintain correct posture at least 75% of the time without external cues in order to maintain correct mechanics and prevent pain exacerbation     Baseline  10/23- improving; 02/10/17 - patient feels she is maintaining proper posture roughly 75% of the time throughuot activities     Time  4    Period  Weeks    Status  Achieved      PT SHORT TERM GOAL #3   Title  Patient to demonstate PROM as being full on all planes (as allowed per protocol) with no pain increase in order to show improvement of condition     Baseline  10/23- 160 shoulder flexion but this is functional, otherwsie goal is met; 02/10/17 - patient is lacking last 10-15* of ROM inf flexion and abduction however it is functional range    Time  4    Period  Weeks    Status  Partially Met        PT Long Term Goals - 02/10/17 0908      PT LONG TERM GOAL #1   Title  Patient to demonstrate AAROM as being full in all planes in order to show improvement of condition and good mobilty of shoulder complex     Baseline  10/23- mild stiffness but remains functional; 02/10/17 - patient is lacking last 5-10* with AAROM for shoulder flex/abduction    Time  8    Period  Weeks    Status  Partially Met      PT LONG TERM GOAL #2   Title  Patient to be able to perform AROM to at least 80% of full on all planes without major compensations in order to show improvement of condition and improving functional task tolerance     Baseline  10/23- compensations present; 02/10/17 - patietn has surpassed 80% of normal ROM for shoulder flexion/abduction     Time  8    Period  Weeks    Status  Achieved      PT LONG TERM GOAL #3   Title  Patient to be able to perform all selfcare and dressing activities with no increase in pain in order to improve functional task performance     Baseline  10/23- DNT 02/10/17 - patient reports no difficulty    Time  8    Period  Weeks    Status  Achieved      PT LONG TERM GOAL #4   Title  Patient to demonstrate MMT as being at least 4/5 in all tested R shoulder muscles in order to improve functional task performance     Baseline  10/23- ongoing; 02/10/17 - patient has met 4/5 or greater wtih MMT for right shoulder  Time  8    Period  Weeks    Status  Achieved       Plan - 02/24/17 1653     Clinical Impression Statement  Patient is continuing to progress well with therapy and has advanced shoulder strengthening. She continues to be limited by poor scapular mobility and requires verbal/tactile cues to reduce compensation with shoulder flexion/abduction. Ms. Devlin will continue to benefit from continued skilled PT services to address current impairments and progress towards remaining goals.    Rehab Potential  Good    Clinical Impairments Affecting Rehab Potential  (+) motivated to participate, moderate outcomes with PT in the past    PT Frequency  1x / week Continue 2x/week until 11/30 then reduce to 1x/week for 4 weeks    PT Treatment/Interventions  ADLs/Self Care Home Management;Biofeedback;Cryotherapy;Electrical Stimulation;Iontophoresis 88m/ml Dexamethasone;Moist Heat;Ultrasound;Gait training;DME Instruction;Stair training;Functional mobility training;Therapeutic activities;Therapeutic exercise;Balance training;Neuromuscular re-education;Patient/family education;Manual techniques;Scar mobilization;Passive range of motion;Dry needling    PT Next Visit Plan  Contniue to introduce serratus anterior stabilization exercises and mobilize [place] for flexion. Initiate scapular facilitaiton in sidelying for flexion/elevation of right shoulder. Week 13 per protocol on 11/20; AROM, light resistance training; Complete joint mobilzation avoid substitutional patterns.     PT Home Exercise Plan  Eval: scapular retractions, chin tucks, 3D cervical excursions, biceps AROM, wrist AROM, grip strength squeezes 10/2- passive shoulder flexion, ABD, ER stretches 10/18- supine AROM of flexion, abduction, ER      Consulted and Agree with Plan of Care  Patient       Patient will benefit from skilled therapeutic intervention in order to improve the following deficits and impairments:  Decreased skin integrity, Improper body mechanics, Decreased coordination, Decreased mobility, Decreased scar mobility,  Postural dysfunction, Decreased strength, Decreased range of motion, Hypomobility, Impaired UE functional use, Impaired flexibility  Visit Diagnosis: Chronic right shoulder pain  Abnormal posture  Muscle weakness (generalized)  Stiffness of right shoulder, not elsewhere classified     Problem List Patient Active Problem List   Diagnosis Date Noted  . Inflamed sebaceous cyst left shoulder blade 04/16/2016  . Vaginal discharge 06/22/2015  . Hypertension 11/23/2012  . Diabetes ([place] 11/23/2012  . Elevated cholesterol 11/23/2012    RDebara Pickett PT, DPT Physical Therapist [facility] 02/24/2017 5:43 PM    [place]S[place] [address]Phone: [phone]  Fax:  [phone] Name: WHILDY NICHOLLMRN: [id number] of Birth: [birth year]

## 2017-02-26 ENCOUNTER — Encounter (HOSPITAL_COMMUNITY): Payer: Self-pay

## 2017-02-26 ENCOUNTER — Telehealth (INDEPENDENT_AMBULATORY_CARE_PROVIDER_SITE_OTHER): Payer: Self-pay | Admitting: Orthopaedic Surgery

## 2017-02-26 NOTE — Telephone Encounter (Signed)
Billing faxed to [facility]

## 2017-03-05 ENCOUNTER — Ambulatory Visit (HOSPITAL_COMMUNITY): Payer: BLUE CROSS/BLUE SHIELD | Admitting: Physical Therapy

## 2017-03-05 ENCOUNTER — Encounter (HOSPITAL_COMMUNITY): Payer: Self-pay | Admitting: Physical Therapy

## 2017-03-05 DIAGNOSIS — M25511 Pain in right shoulder: Principal | ICD-10-CM

## 2017-03-05 DIAGNOSIS — R293 Abnormal posture: Secondary | ICD-10-CM

## 2017-03-05 DIAGNOSIS — M6281 Muscle weakness (generalized): Secondary | ICD-10-CM | POA: Diagnosis not present

## 2017-03-05 DIAGNOSIS — M25611 Stiffness of right shoulder, not elsewhere classified: Secondary | ICD-10-CM

## 2017-03-05 DIAGNOSIS — G8929 Other chronic pain: Secondary | ICD-10-CM | POA: Diagnosis not present

## 2017-03-05 NOTE — Therapy (Signed)
[place], [place], [address] Phone: [phone]   Fax:  [phone]  Physical Therapy Treatment  Patient Details  Name: Karina Kirk MRN: [id number] Date of Birth: [birth year] Referring Provider: Sydnee Cabal   Encounter Date: 03/05/2017  PT End of Session - 03/05/17 1703    Visit Number  28    Number of Visits  28    Date for PT Re-Evaluation  02/10/17    Authorization Type  BCBS Other     Authorization Time Period  11/20/16 to 01/20/17; new 01/13/17 to 02/13/17; 02/10/17-03/24/17    PT Start Time  1650    PT Stop Time  1703    PT Time Calculation (min)  13 min    Activity Tolerance  Patient tolerated treatment well;No increased pain    Behavior During Therapy  WFL for tasks assessed/performed       Past Medical History:  Diagnosis Date  . Diabetes mellitus without complication ([place])   . Elevated cholesterol   . Hypertension   . Sebaceous cyst   . Vaginal discharge 06/22/2015    Past Surgical History:  Procedure Laterality Date  . ABDOMINAL HYSTERECTOMY      There were no vitals filed for this visit.  Subjective Assessment - 03/05/17 1648    Subjective  Pt has no pain or concerns today.   States that she is able to do everything that she wants.     Pertinent History  DM, HTN     Patient Stated Goals  get out of the sling and get back to moving well     Currently in Pain?  No/denies         [facility] PT Assessment - 03/05/17 0001      Assessment   Medical Diagnosis  complete R RTC tear     Onset Date/Surgical Date  11/13/16    Hand Dominance  Right    Next MD Visit  Dr. Theda Sers November 28th      Prior Therapy  PT for her back and shoulder earlier this year       AROM   Right Shoulder Flexion  160 Degrees  PROM 168 ;     Right Shoulder ABduction  170 Degrees was 155 on 11/20     Right Shoulder Internal Rotation  80 Degrees    Right Shoulder External Rotation  80 Degrees      PROM   Right  Shoulder Flexion  170 Degrees    Right Shoulder ABduction  175 Degrees      Strength   Right Shoulder Flexion  4+/5 at 90 *    Right Shoulder ABduction  4+/5 at 45*    Right Shoulder Internal Rotation  4+/5 at 90*, 4+/5 at 0*    Right Shoulder External Rotation  5/5 at 90*, 4+/5 at 0[facility] Adult PT Treatment/Exercise - 03/05/17 0001      Shoulder Exercises: Supine   Other Supine Exercises  PROM       Shoulder Exercises: Seated   Flexion  Both;5 reps    Abduction  Both;5 reps      Shoulder Exercises: Standing   Other Standing Exercises  wall walks for shuolder flexion with lift off; 1x 15 reps (cueing to reduce lumbar extension               PT Short Term  Goals - 03/05/17 1659      PT SHORT TERM GOAL #1   Title  Patient to be compliant with correct performance of HEP, to be updated as protocol allows     Baseline  10/23- compliant     Time  1    Period  Weeks    Status  Achieved      PT SHORT TERM GOAL #2   Title  Patient to be able to maintain correct posture at least 75% of the time without external cues in order to maintain correct mechanics and prevent pain exacerbation     Baseline  10/23- improving; 02/10/17 - patient feels she is maintaining proper posture roughly 75% of the time throughuot activities    Time  4    Period  Weeks    Status  Achieved      PT SHORT TERM GOAL #3   Title  Patient to demonstate PROM as being full on all planes (as allowed per protocol) with no pain increase in order to show improvement of condition     Baseline  10/23- 160 shoulder flexion but this is functional, otherwsie goal is met; 02/10/17 - patient is lacking last 10-15* of ROM inf flexion and abduction however it is functional range    Time  4    Period  Weeks    Status  Achieved        PT Long Term Goals - 03/05/17 1659      PT LONG TERM GOAL #1   Title  Patient to demonstrate AAROM as being full in all planes in order to show  improvement of condition and good mobilty of shoulder complex     Baseline  10/23- mild stiffness but remains functional; 02/10/17 - patient is lacking last 5-10* with AAROM for shoulder flex/abduction    Time  8    Period  Weeks    Status  Achieved      PT LONG TERM GOAL #2   Title  Patient to be able to perform AROM to at least 80% of full on all planes without major compensations in order to show improvement of condition and improving functional task tolerance     Baseline  10/23- compensations present; 02/10/17 - patietn has surpassed 80% of normal ROM for shoulder flexion/abduction     Time  8    Period  Weeks    Status  Achieved      PT LONG TERM GOAL #3   Title  Patient to be able to perform all selfcare and dressing activities with no increase in pain in order to improve functional task performance     Baseline  10/23- DNT 02/10/17 - patient reports no difficulty    Time  8    Period  Weeks    Status  Achieved      PT LONG TERM GOAL #4   Title  Patient to demonstrate MMT as being at least 4/5 in all tested R shoulder muscles in order to improve functional task performance     Baseline  10/23- ongoing; 02/10/17 - patient has met 4/5 or greater wtih MMT for right shoulder    Time  8    Period  Weeks    Status  Achieved            Plan - 03/05/17 1704    Clinical Impression Statement  PT reassessed.  Pt has returned to work and is having no difficulty.  Pt has met all goals.  Pt is ready for discharge.     Rehab Potential  Good    Clinical Impairments Affecting Rehab Potential  (+) motivated to participate, moderate outcomes with PT in the past    PT Frequency  1x / week Continue 2x/week until 11/30 then reduce to 1x/week for 4 weeks    PT Treatment/Interventions  ADLs/Self Care Home Management;Biofeedback;Cryotherapy;Electrical Stimulation;Iontophoresis 67m/ml Dexamethasone;Moist Heat;Ultrasound;Gait training;DME Instruction;Stair training;Functional mobility  training;Therapeutic activities;Therapeutic exercise;Balance training;Neuromuscular re-education;Patient/family education;Manual techniques;Scar mobilization;Passive range of motion;Dry needling    PT Next Visit Plan   Discharge to HEP    PT Home Exercise Plan  Eval: scapular retractions, chin tucks, 3D cervical excursions, biceps AROM, wrist AROM, grip strength squeezes 10/2- passive shoulder flexion, ABD, ER stretches 10/18- supine AROM of flexion, abduction, ER      Consulted and Agree with Plan of Care  Patient       Patient will benefit from skilled therapeutic intervention in order to improve the following deficits and impairments:  Decreased skin integrity, Improper body mechanics, Decreased coordination, Decreased mobility, Decreased scar mobility, Postural dysfunction, Decreased strength, Decreased range of motion, Hypomobility, Impaired UE functional use, Impaired flexibility  Visit Diagnosis: Chronic right shoulder pain  Abnormal posture  Muscle weakness (generalized)  Stiffness of right shoulder, not elsewhere classified     Problem List Patient Active Problem List   Diagnosis Date Noted  . Inflamed sebaceous cyst left shoulder blade 04/16/2016  . Vaginal discharge 06/22/2015  . Hypertension 11/23/2012  . Diabetes ([place] 11/23/2012  . Elevated cholesterol 11/23/2012    CRayetta Humphrey PT CLT [phone]/13/2018, 5:05 PM  [place] [address]Phone: [phone]  Fax:  [phone] Name: Karina COLLAZOSMRN: [id number] of Birth: 906/07/1955 PHYSICAL THERAPY DISCHARGE SUMMARY  Visits from Start of Care: Current functional level related to goals / functional outcomes: See above    Remaining deficits: See above   Education / Equipment: HEP Plan: Patient agrees to discharge.  Patient goals were met. Patient is being discharged due to meeting the stated rehab goals.  ?????        CRayetta Humphrey PAltoonaCLT [phone]

## 2017-03-10 ENCOUNTER — Encounter (HOSPITAL_COMMUNITY): Payer: Self-pay

## 2017-03-18 ENCOUNTER — Encounter (HOSPITAL_COMMUNITY): Payer: Self-pay

## 2017-03-19 DIAGNOSIS — Z4789 Encounter for other orthopedic aftercare: Secondary | ICD-10-CM | POA: Diagnosis not present

## 2017-03-19 DIAGNOSIS — S46011D Strain of muscle(s) and tendon(s) of the rotator cuff of right shoulder, subsequent encounter: Secondary | ICD-10-CM | POA: Diagnosis not present

## 2017-08-21 DIAGNOSIS — Z79899 Other long term (current) drug therapy: Secondary | ICD-10-CM | POA: Diagnosis not present

## 2017-08-25 DIAGNOSIS — R110 Nausea: Secondary | ICD-10-CM | POA: Diagnosis not present

## 2017-08-25 DIAGNOSIS — E782 Mixed hyperlipidemia: Secondary | ICD-10-CM | POA: Diagnosis not present

## 2017-08-25 DIAGNOSIS — I10 Essential (primary) hypertension: Secondary | ICD-10-CM | POA: Diagnosis not present

## 2017-08-25 DIAGNOSIS — E1165 Type 2 diabetes mellitus with hyperglycemia: Secondary | ICD-10-CM | POA: Diagnosis not present

## 2017-10-13 DIAGNOSIS — Z1231 Encounter for screening mammogram for malignant neoplasm of breast: Secondary | ICD-10-CM | POA: Diagnosis not present

## 2017-11-04 DIAGNOSIS — E782 Mixed hyperlipidemia: Secondary | ICD-10-CM | POA: Diagnosis not present

## 2017-11-04 DIAGNOSIS — L309 Dermatitis, unspecified: Secondary | ICD-10-CM | POA: Diagnosis not present

## 2017-11-04 DIAGNOSIS — E1169 Type 2 diabetes mellitus with other specified complication: Secondary | ICD-10-CM | POA: Diagnosis not present

## 2017-11-04 DIAGNOSIS — I10 Essential (primary) hypertension: Secondary | ICD-10-CM | POA: Diagnosis not present
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
## 2018-01-22 DIAGNOSIS — K219 Gastro-esophageal reflux disease without esophagitis: Secondary | ICD-10-CM | POA: Diagnosis not present

## 2018-01-22 DIAGNOSIS — N390 Urinary tract infection, site not specified: Secondary | ICD-10-CM | POA: Diagnosis not present

## 2018-01-22 DIAGNOSIS — L309 Dermatitis, unspecified: Secondary | ICD-10-CM | POA: Diagnosis not present

## 2018-01-22 DIAGNOSIS — R300 Dysuria: Secondary | ICD-10-CM | POA: Diagnosis not present

## 2018-01-22 DIAGNOSIS — I10 Essential (primary) hypertension: Secondary | ICD-10-CM | POA: Diagnosis not present

## 2018-01-26 ENCOUNTER — Encounter: Payer: Self-pay | Admitting: Internal Medicine

## 2018-02-22 ENCOUNTER — Ambulatory Visit: Payer: BLUE CROSS/BLUE SHIELD | Admitting: Obstetrics & Gynecology

## 2018-02-22 ENCOUNTER — Encounter (INDEPENDENT_AMBULATORY_CARE_PROVIDER_SITE_OTHER): Payer: Self-pay

## 2018-02-22 ENCOUNTER — Encounter: Payer: Self-pay | Admitting: Obstetrics & Gynecology

## 2018-02-22 VITALS — BP 113/76 | HR 85 | Ht 63.0 in | Wt 156.0 lb

## 2018-02-22 DIAGNOSIS — L723 Sebaceous cyst: Secondary | ICD-10-CM

## 2018-02-22 NOTE — Progress Notes (Signed)
Chief Complaint  Patient presents with  . Vaginal Itching      63 y.o. G2P2 No LMP recorded. Patient has had a hysterectomy. The current method of family planning is status post hysterectomy.  Outpatient Encounter Medications as of 02/22/2018  Medication Sig  . amLODipine-valsartan (EXFORGE) 10-320 MG tablet Take 1 tablet by mouth daily.   Marland Kitchen ezetimibe-simvastatin (VYTORIN) 10-20 MG tablet Take 1 tablet by mouth daily.  Marland Kitchen glipiZIDE (GLUCOTROL XL) 10 MG 24 hr tablet Take 10 mg by mouth daily with breakfast.   . metFORMIN (GLUCOPHAGE-XR) 500 MG 24 hr tablet Take 1,000 mg by mouth daily.  . hydrochlorothiazide (MICROZIDE) 12.5 MG capsule Take 12.5 mg by mouth daily.   Marland Kitchen JANUMET XR [phone] MG TB24 Take 1 tablet by mouth daily.   No facility-administered encounter medications on file as of 02/22/2018.     Subjective Pt has concern over a couple areas on her bottom Non tender Bumps On exam she has a sebaceous cyst and a couple of hair follicles  Past Medical History:  Diagnosis Date  . Diabetes mellitus without complication ([place])   . Elevated cholesterol   . Hypertension   . Sebaceous cyst   . Vaginal discharge 06/22/2015    Past Surgical History:  Procedure Laterality Date  . ABDOMINAL HYSTERECTOMY      OB History    Gravida  2   Para  2   Term      Preterm      AB      Living  2     SAB      TAB      Ectopic      Multiple      Live Births  2           Allergies  Allergen Reactions  . Sulfa Antibiotics Rash    Social History   Socioeconomic History  . Marital status: Single    Spouse name: Not on file  . Number of children: Not on file  . Years of education: Not on file  . Highest education level: Not on file  Occupational History  . Not on file  Social Needs  . Financial resource strain: Not on file  . Food insecurity:    Worry: Not on file    Inability: Not on file  . Transportation needs:    Medical: Not on file   Non-medical: Not on file  Tobacco Use  . Smoking status: Never Smoker  . Smokeless tobacco: Never Used  Substance and Sexual Activity  . Alcohol use: No    Comment: occ. beer  . Drug use: No  . Sexual activity: Not Currently    Birth control/protection: Surgical    Comment: hyst  Lifestyle  . Physical activity:    Days per week: Not on file    Minutes per session: Not on file  . Stress: Not on file  Relationships  . Social connections:    Talks on phone: Not on file    Gets together: Not on file    Attends religious service: Not on file    Active member of club or organization: Not on file    Attends meetings of clubs or organizations: Not on file    Relationship status: Not on file  Other Topics Concern  . Not on file  Social History Narrative  . Not on file    Family History  Problem Relation Age of Onset  . Diabetes Mother   .  Hypertension Mother   . Diabetes Sister   . Hypertension Sister     Medications:       Current Outpatient Medications:  .  amLODipine-valsartan (EXFORGE) 10-320 MG tablet, Take 1 tablet by mouth daily. , Disp: , Rfl: 0 .  ezetimibe-simvastatin (VYTORIN) 10-20 MG tablet, Take 1 tablet by mouth daily., Disp: , Rfl:  .  glipiZIDE (GLUCOTROL XL) 10 MG 24 hr tablet, Take 10 mg by mouth daily with breakfast. , Disp: , Rfl: 0 .  metFORMIN (GLUCOPHAGE-XR) 500 MG 24 hr tablet, Take 1,000 mg by mouth daily., Disp: , Rfl: 5 .  hydrochlorothiazide (MICROZIDE) 12.5 MG capsule, Take 12.5 mg by mouth daily. , Disp: , Rfl: 0 .  JANUMET XR [phone] MG TB24, Take 1 tablet by mouth daily., Disp: , Rfl: 0  Objective Blood pressure 113/76, pulse 85, height 5\' 3"  (1.6 m), weight 156 lb (70.8 kg).  General WDWN female NAD Vulva:  normal appearing vulva with no masses, tenderness or lesions, 1 sebaceous cyst, couple hair follicles Vagina:  normal mucosa, no discharge Cervix:  absent Uterus:  uterus absent Adnexa: ovaries:not present,     Pertinent ROS No  burning with urination, frequency or urgency No nausea, vomiting or diarrhea Nor fever chills or other constitutional symptoms   Labs or studies     Impression Diagnoses this Encounter::   ICD-10-CM   1. Sebaceous cyst L72.3     Established relevant diagnosis(es):   Plan/Recommendations: No orders of the defined types were placed in this encounter.   Labs or Scans Ordered: No orders of the defined types were placed in this encounter.   Management:: >no follow up is needed .   All questions were answered.

## 2018-03-15 DIAGNOSIS — E782 Mixed hyperlipidemia: Secondary | ICD-10-CM | POA: Diagnosis not present

## 2018-03-15 DIAGNOSIS — I10 Essential (primary) hypertension: Secondary | ICD-10-CM | POA: Diagnosis not present

## 2018-03-15 DIAGNOSIS — E1165 Type 2 diabetes mellitus with hyperglycemia: Secondary | ICD-10-CM | POA: Diagnosis not present

## 2018-03-15 DIAGNOSIS — E1169 Type 2 diabetes mellitus with other specified complication: Secondary | ICD-10-CM | POA: Diagnosis not present

## 2018-03-19 ENCOUNTER — Telehealth: Payer: Self-pay | Admitting: Obstetrics & Gynecology

## 2018-03-19 NOTE — Telephone Encounter (Signed)
LMOVM that urine was not sent for culture.

## 2018-03-19 NOTE — Telephone Encounter (Signed)
Patient called and stated that she recently had labs and her urine still has an odor to it.  She would like to know the results from her urine sample.  [facility]  [phone], may have to leave a message

## 2018-03-26 DIAGNOSIS — E1165 Type 2 diabetes mellitus with hyperglycemia: Secondary | ICD-10-CM | POA: Diagnosis not present

## 2018-03-26 DIAGNOSIS — E782 Mixed hyperlipidemia: Secondary | ICD-10-CM | POA: Diagnosis not present

## 2018-03-26 DIAGNOSIS — I10 Essential (primary) hypertension: Secondary | ICD-10-CM | POA: Diagnosis not present

## 2018-03-26 DIAGNOSIS — E1169 Type 2 diabetes mellitus with other specified complication: Secondary | ICD-10-CM | POA: Diagnosis not present

## 2018-03-26 DIAGNOSIS — M75101 Unspecified rotator cuff tear or rupture of right shoulder, not specified as traumatic: Secondary | ICD-10-CM | POA: Diagnosis not present

## 2018-03-26 DIAGNOSIS — R300 Dysuria: Secondary | ICD-10-CM | POA: Diagnosis not present

## 2018-04-29 ENCOUNTER — Ambulatory Visit: Payer: BLUE CROSS/BLUE SHIELD | Admitting: Gastroenterology

## 2018-09-27 ENCOUNTER — Ambulatory Visit: Payer: BC Managed Care – PPO | Admitting: Gastroenterology

## 2018-09-27 ENCOUNTER — Other Ambulatory Visit: Payer: Self-pay

## 2018-09-27 ENCOUNTER — Encounter: Payer: Self-pay | Admitting: Gastroenterology

## 2018-09-27 DIAGNOSIS — R1310 Dysphagia, unspecified: Secondary | ICD-10-CM | POA: Diagnosis not present

## 2018-09-27 NOTE — Progress Notes (Signed)
Primary Care Physician:  Celene Squibb, MD  Referring Physician: Dr. Nevada Crane  Primary Gastroenterologist:  Dr. Gala Romney   Chief Complaint  Patient presents with  . Gastroesophageal Reflux    Pantoprazole 40 mg , does take everyday  . Colonoscopy    ref by Dr. Allyn Kenner  . EGD    possibly needed per Dr. Nevada Crane.     HPI:   Karina Kirk is a 63 y.o. female presenting today at the request of Dr. Nevada Crane for screening colonoscopy and possible EGD due to dysphagia. Last colonoscopy normal in 2011 by Dr. Gala Romney was normal.  Solid food dysphagia. Sometimes will be fine without any issues. Will be eating and good until nearing the last bite and has to regurgitate it. No odynophagia. No abdominal pain. No significant changes in bowel habits. Will have small "balls" for BMs occasionally but not often. No straining. No rectal bleeding. Good appetite. No unexplained weight loss. Denying any GERD symptoms. Not taking Protonix daily.   Past Medical History:  Diagnosis Date  . Diabetes mellitus without complication ([place])   . Elevated cholesterol   . Hypertension   . Sebaceous cyst   . Vaginal discharge 06/22/2015    Past Surgical History:  Procedure Laterality Date  . ABDOMINAL HYSTERECTOMY    . COLONOSCOPY  2011   Dr. Gala Romney:  normal  . right rotator cuff repair      Current Outpatient Medications  Medication Sig Dispense Refill  . amLODipine-valsartan (EXFORGE) 10-320 MG tablet Take 1 tablet by mouth daily.   0  . aspirin EC 81 MG tablet Take 81 mg by mouth daily.    Marland Kitchen ezetimibe-simvastatin (VYTORIN) 10-20 MG tablet Take 1 tablet by mouth daily.    Marland Kitchen glipiZIDE (GLUCOTROL XL) 10 MG 24 hr tablet Take 10 mg by mouth daily with breakfast.   0  . hydrochlorothiazide (MICROZIDE) 12.5 MG capsule Take 12.5 mg by mouth daily.   0  . JANUMET XR [id number] MG TB24 Take 1 tablet by mouth daily.  0  . metFORMIN (GLUCOPHAGE-XR) 500 MG 24 hr tablet Take 1,000 mg by mouth daily.  5  . pantoprazole (PROTONIX) 40  MG tablet Take 40 mg by mouth daily.     No current facility-administered medications for this visit.     Allergies as of 09/27/2018 - Review Complete 09/27/2018  Allergen Reaction Noted  . Sulfa antibiotics Rash 10/26/2010    Family History  Problem Relation Age of Onset  . Diabetes Mother   . Hypertension Mother   . Diabetes Sister   . Hypertension Sister   . Colon cancer Neg Hx   . Colon polyps Neg Hx     Social History   Socioeconomic History  . Marital status: Single    Spouse name: Not on file  . Number of children: Not on file  . Years of education: Not on file  . Highest education level: Not on file  Occupational History  . Not on file  Social Needs  . Financial resource strain: Not on file  . Food insecurity    Worry: Not on file    Inability: Not on file  . Transportation needs    Medical: Not on file    Non-medical: Not on file  Tobacco Use  . Smoking status: Never Smoker  . Smokeless tobacco: Never Used  Substance and Sexual Activity  . Alcohol use: Yes    Comment: once a year will have beer  . Drug  use: No  . Sexual activity: Not Currently    Birth control/protection: Surgical    Comment: hyst  Lifestyle  . Physical activity    Days per week: Not on file    Minutes per session: Not on file  . Stress: Not on file  Relationships  . Social Herbalist on phone: Not on file    Gets together: Not on file    Attends religious service: Not on file    Active member of club or organization: Not on file    Attends meetings of clubs or organizations: Not on file    Relationship status: Not on file  . Intimate partner violence    Fear of current or ex partner: Not on file    Emotionally abused: Not on file    Physically abused: Not on file    Forced sexual activity: Not on file  Other Topics Concern  . Not on file  Social History Narrative  . Not on file    Review of Systems: Gen: Denies any fever, chills, fatigue, weight loss, lack  of appetite.  CV: Denies chest pain, heart palpitations, peripheral edema, syncope.  Resp: Denies shortness of breath at rest or with exertion. Denies wheezing or cough.  GI: see HPI GU : Denies urinary burning, urinary frequency, urinary hesitancy MS: Denies joint pain, muscle weakness, cramps, or limitation of movement.  Derm: Denies rash, itching, dry skin Psych: Denies depression, anxiety, memory loss, and confusion Heme: Denies bruising, bleeding, and enlarged lymph nodes.  Physical Exam: BP 114/72   Pulse 80   Temp 99.7 F (37.6 C) (Oral)   Ht 5\' 4"  (1.626 m)   Wt 153 lb 6.4 oz (69.6 kg)   BMI 26.33 kg/m  General:   Alert and oriented. Pleasant and cooperative. Well-nourished and well-developed.  Head:  Normocephalic and atraumatic. Eyes:  Without icterus, sclera clear and conjunctiva pink.  Ears:  Normal auditory acuity. Nose:  No deformity, discharge,  or lesions. Mouth:  No deformity or lesions, oral mucosa pink.  Lungs:  Clear to auscultation bilaterally. No wheezes, rales, or rhonchi. No distress.  Heart:  S1, S2 present without murmurs appreciated.  Abdomen:  +BS, soft, non-tender and non-distended. No HSM noted. No guarding or rebound. No masses appreciated.  Rectal:  Deferred  Msk:  Symmetrical without gross deformities. Normal posture. Extremities:  Without  edema. Neurologic:  Alert and  oriented x4 Psych:  Alert and cooperative. Normal mood and affect.

## 2018-09-27 NOTE — Assessment & Plan Note (Signed)
64 year old female with solid food dysphagia, no prior EGD, denying weight loss, loss of appetite, or typical GERD symptoms. She desires to hold off on PPI therapy until after EGD. Discussed pursuing EGD/dilatation in near future with Dr. Gala Romney. Although she was referred for screening colonoscopy as well, this is not due until summer 2021, and she has no concerning lower GI signs/symptoms and no family history of colorectal cancer or polyps. Will have her triaged when it is closer to this date.   Proceed with upper endoscopy /dilation in the near future with Dr. Gala Romney. The risks, benefits, and alternatives have been discussed in detail with patient. They have stated understanding and desire to proceed.

## 2018-09-27 NOTE — Patient Instructions (Signed)
We have arranged an upper endoscopy with dilation by Dr. Gala Romney in the near future.  Please call if you have heartburn, indigestion, or worsening swallowing.   Your next colonoscopy is in 2021. We can triage you over the phone for this!  It was a pleasure to see you today. I strive to create trusting relationships with patients to provide genuine, compassionate, and quality care. I value your feedback. If you receive a survey regarding your visit,  I greatly appreciate you taking time to fill this out.   Annitta Needs, PhD, ANP-BC [facility]

## 2018-09-27 NOTE — Patient Instructions (Signed)
Pt aware to not take any diabetic medications the morning of EGD/DIL. Handwrote on instructions after printed.

## 2018-09-27 NOTE — Progress Notes (Signed)
CC'D TO PCP °

## 2018-11-05 ENCOUNTER — Other Ambulatory Visit (HOSPITAL_COMMUNITY)
Admission: RE | Admit: 2018-11-05 | Discharge: 2018-11-05 | Disposition: A | Discharge status: Home / Self Care | Payer: BC Managed Care – PPO | Source: Ambulatory Visit | Attending: Internal Medicine | Admitting: Internal Medicine

## 2018-11-05 ENCOUNTER — Other Ambulatory Visit: Payer: Self-pay

## 2018-11-05 DIAGNOSIS — Z01812 Encounter for preprocedural laboratory examination: Secondary | ICD-10-CM | POA: Diagnosis not present

## 2018-11-05 DIAGNOSIS — Z20828 Contact with and (suspected) exposure to other viral communicable diseases: Secondary | ICD-10-CM | POA: Diagnosis not present

## 2018-11-05 LAB — SARS CORONAVIRUS 2 (TAT 6-24 HRS): SARS Coronavirus 2: NEGATIVE

## 2018-11-09 ENCOUNTER — Ambulatory Visit (HOSPITAL_COMMUNITY)
Admission: RE | Admit: 2018-11-09 | Discharge: 2018-11-09 | Disposition: A | Discharge status: Home / Self Care | Payer: BC Managed Care – PPO | Attending: Internal Medicine | Admitting: Internal Medicine

## 2018-11-09 ENCOUNTER — Encounter (HOSPITAL_COMMUNITY)
Admission: RE | Disposition: A | Discharge status: Home / Self Care | Payer: Self-pay | Source: Home / Self Care | Attending: Internal Medicine

## 2018-11-09 ENCOUNTER — Other Ambulatory Visit: Payer: Self-pay

## 2018-11-09 ENCOUNTER — Encounter (HOSPITAL_COMMUNITY): Payer: Self-pay | Admitting: *Deleted

## 2018-11-09 DIAGNOSIS — K210 Gastro-esophageal reflux disease with esophagitis: Secondary | ICD-10-CM | POA: Diagnosis not present

## 2018-11-09 DIAGNOSIS — E7800 Pure hypercholesterolemia, unspecified: Secondary | ICD-10-CM | POA: Diagnosis not present

## 2018-11-09 DIAGNOSIS — K209 Esophagitis, unspecified: Secondary | ICD-10-CM | POA: Diagnosis not present

## 2018-11-09 DIAGNOSIS — Z7982 Long term (current) use of aspirin: Secondary | ICD-10-CM | POA: Diagnosis not present

## 2018-11-09 DIAGNOSIS — Z79899 Other long term (current) drug therapy: Secondary | ICD-10-CM | POA: Diagnosis not present

## 2018-11-09 DIAGNOSIS — K3189 Other diseases of stomach and duodenum: Secondary | ICD-10-CM | POA: Diagnosis not present

## 2018-11-09 DIAGNOSIS — I10 Essential (primary) hypertension: Secondary | ICD-10-CM | POA: Insufficient documentation

## 2018-11-09 DIAGNOSIS — R1310 Dysphagia, unspecified: Secondary | ICD-10-CM | POA: Insufficient documentation

## 2018-11-09 DIAGNOSIS — E119 Type 2 diabetes mellitus without complications: Secondary | ICD-10-CM | POA: Insufficient documentation

## 2018-11-09 DIAGNOSIS — Z7984 Long term (current) use of oral hypoglycemic drugs: Secondary | ICD-10-CM | POA: Insufficient documentation

## 2018-11-09 DIAGNOSIS — K449 Diaphragmatic hernia without obstruction or gangrene: Secondary | ICD-10-CM | POA: Diagnosis not present

## 2018-11-09 HISTORY — PX: ESOPHAGOGASTRODUODENOSCOPY: SHX5428

## 2018-11-09 HISTORY — PX: BIOPSY: SHX5522

## 2018-11-09 LAB — GLUCOSE, CAPILLARY: Glucose-Capillary: 94 mg/dL (ref 70–99)

## 2018-11-09 SURGERY — EGD (ESOPHAGOGASTRODUODENOSCOPY)
Anesthesia: Moderate Sedation

## 2018-11-09 MED ORDER — SODIUM CHLORIDE 0.9 % IV SOLN
INTRAVENOUS | Status: DC
  Administered 2018-11-09: 11:00:00 via INTRAVENOUS

## 2018-11-09 MED ORDER — MIDAZOLAM HCL 5 MG/5ML IJ SOLN
INTRAMUSCULAR | Status: AC
  Filled 2018-11-09: qty 10

## 2018-11-09 MED ORDER — ONDANSETRON HCL 4 MG/2ML IJ SOLN
INTRAMUSCULAR | Status: AC
  Filled 2018-11-09: qty 2

## 2018-11-09 MED ORDER — LIDOCAINE VISCOUS HCL 2 % MT SOLN
OROMUCOSAL | Status: DC | PRN
  Administered 2018-11-09: 1 via OROMUCOSAL

## 2018-11-09 MED ORDER — MEPERIDINE HCL 50 MG/ML IJ SOLN
INTRAMUSCULAR | Status: AC
  Filled 2018-11-09: qty 1

## 2018-11-09 MED ORDER — MIDAZOLAM HCL 5 MG/5ML IJ SOLN
INTRAMUSCULAR | Status: DC | PRN
  Administered 2018-11-09: 2 mg via INTRAVENOUS
  Administered 2018-11-09: 1 mg via INTRAVENOUS

## 2018-11-09 MED ORDER — ONDANSETRON HCL 4 MG/2ML IJ SOLN
INTRAMUSCULAR | Status: DC | PRN
  Administered 2018-11-09: 4 mg via INTRAVENOUS

## 2018-11-09 MED ORDER — MEPERIDINE HCL 100 MG/ML IJ SOLN
INTRAMUSCULAR | Status: DC | PRN
  Administered 2018-11-09: 25 mg

## 2018-11-09 MED ORDER — LIDOCAINE VISCOUS HCL 2 % MT SOLN
OROMUCOSAL | Status: AC
  Filled 2018-11-09: qty 15

## 2018-11-09 NOTE — H&P (Signed)
@LOGO @   Primary Care Physician:  Celene Squibb, MD Primary Gastroenterologist:  Dr. Gala Romney  Pre-Procedure History & Physical: HPI:  Karina Kirk is a 63 y.o. female here for history of GERD now insidious [place] developing esophageal dysphagia.  Here for an EGD with possible esophageal dilation.  Does take Protonix 40 mg daily; does not always take it daily if she perceives she does not have frequent GERD  Past Medical History:  Diagnosis Date  . Diabetes mellitus without complication ([place])   . Elevated cholesterol   . Hypertension   . Sebaceous cyst   . Vaginal discharge 06/22/2015    Past Surgical History:  Procedure Laterality Date  . ABDOMINAL HYSTERECTOMY    . COLONOSCOPY  2011   Dr. Gala Romney:  normal  . right rotator cuff repair      Prior to Admission medications   Medication Sig Start Date End Date Taking? Authorizing Provider  amLODipine-valsartan (EXFORGE) 10-320 MG tablet Take 1 tablet by mouth daily.  05/21/15  Yes [provider]  aspirin EC 81 MG tablet Take 81 mg by mouth daily.   Yes [provider]  glipiZIDE (GLUCOTROL XL) 10 MG 24 hr tablet Take 10 mg by mouth daily with breakfast.  05/20/15  Yes [provider]  hydrochlorothiazide (MICROZIDE) 12.5 MG capsule Take 12.5 mg by mouth daily.  05/21/15  Yes [provider]  metFORMIN (GLUCOPHAGE-XR) 500 MG 24 hr tablet Take 1,000 mg by mouth daily. 01/05/18  Yes [provider]  pantoprazole (PROTONIX) 40 MG tablet Take 40 mg by mouth daily.   Yes [provider]  pravastatin (PRAVACHOL) 40 MG tablet Take 40 mg by mouth daily. 08/09/18  Yes [provider]    Allergies as of 09/27/2018 - Review Complete 09/27/2018  Allergen Reaction Noted  . Sulfa antibiotics Rash 10/26/2010    Family History  Problem Relation Age of Onset  . Diabetes Mother   . Hypertension Mother   . Diabetes Sister   . Hypertension Sister   . Colon cancer Neg Hx   . Colon polyps Neg  Hx     Social History   Socioeconomic History  . Marital status: Single    Spouse name: Not on file  . Number of children: Not on file  . Years of education: Not on file  . Highest education level: Not on file  Occupational History  . Not on file  Social Needs  . Financial resource strain: Not on file  . Food insecurity    Worry: Not on file    Inability: Not on file  . Transportation needs    Medical: Not on file    Non-medical: Not on file  Tobacco Use  . Smoking status: Never Smoker  . Smokeless tobacco: Never Used  Substance and Sexual Activity  . Alcohol use: Yes    Comment: once a year will have beer  . Drug use: No  . Sexual activity: Not Currently    Birth control/protection: Surgical    Comment: hyst  Lifestyle  . Physical activity    Days per week: Not on file    Minutes per session: Not on file  . Stress: Not on file  Relationships  . Social Herbalist on phone: Not on file    Gets together: Not on file    Attends religious service: Not on file    Active member of club or organization: Not on file    Attends meetings  of clubs or organizations: Not on file    Relationship status: Not on file  . Intimate partner violence    Fear of current or ex partner: Not on file    Emotionally abused: Not on file    Physically abused: Not on file    Forced sexual activity: Not on file  Other Topics Concern  . Not on file  Social History Narrative  . Not on file    Review of Systems: See HPI, otherwise negative ROS  Physical Exam: BP 133/68   Pulse 67   Temp 98.4 F (36.9 C) (Oral)   Resp 20   Ht 5\' 4"  (1.626 m)   Wt 70.3 kg   SpO2 98%   BMI 26.61 kg/m  General:   Alert,  Well-developed, well-nourished, pleasant and cooperative in NAD Neck:  Supple; no masses or thyromegaly. No significant cervical adenopathy. Lungs:  Clear throughout to auscultation.   No wheezes, crackles, or rhonchi. No acute distress. Heart:  Regular rate and rhythm; no  murmurs, clicks, rubs,  or gallops. Abdomen: Non-distended, normal bowel sounds.  Soft and nontender without appreciable mass or hepatosplenomegaly.  Pulses:  Normal pulses noted. Extremities:  Without clubbing or edema.  Impression/Plan: 63 year old lady with GERD now vague esophageal dysphagia to solids.  Here for EGD with possible esophageal dilation as feasible/appropriate per plan.   The risks, benefits, limitations, alternatives and imponderables have been reviewed with the patient. Potential for esophageal dilation, biopsy, etc. have also been reviewed.  Questions have been answered. All parties agreeable.     Notice: This dictation was prepared with Dragon dictation along with smaller phrase technology. Any transcriptional errors that result from this process are unintentional and may not be corrected upon review.

## 2018-11-09 NOTE — Discharge Instructions (Signed)

## 2018-11-09 NOTE — Op Note (Signed)
[facility] Patient Name: Karina Kirk Procedure Date: 11/09/2018 10:36 AM MRN: [id number] Date of Birth: 1956/01/17 Attending MD: Norvel Richards , MD CSN: [id number] Age: 63 Admit Type: Outpatient Procedure:                Upper GI endoscopy Indications:              Dysphagia Providers:                Norvel Richards, MD, Charlsie Quest. Theda Sers RN, RN,                            Janeece Riggers, RN, Aram Candela Referring MD:              Medicines:                Midazolam 3 mg IV, Meperidine 25 mg IV Complications:            No immediate complications. Estimated Blood Loss:     Estimated blood loss was minimal. Procedure:                Pre-Anesthesia Assessment:                           - Prior to the procedure, a History and Physical                            was performed, and patient medications and                            allergies were reviewed. The patient's tolerance of                            previous anesthesia was also reviewed. The risks                            and benefits of the procedure and the sedation                            options and risks were discussed with the patient.                            All questions were answered, and informed consent                            was obtained. Prior Anticoagulants: The patient has                            taken no previous anticoagulant or antiplatelet                            agents. ASA Grade Assessment: II - A patient with                            mild systemic disease. After reviewing the risks  and benefits, the patient was deemed in                            satisfactory condition to undergo the procedure.                           After obtaining informed consent, the endoscope was                            passed under direct vision. Throughout the                            procedure, the patient's blood pressure, pulse, and   oxygen saturations were monitored continuously. The                            GIF-H190 ([id number]) scope was introduced through the                            mouth, and advanced to the second part of duodenum.                            The upper GI endoscopy was accomplished without                            difficulty. The patient tolerated the procedure                            well. Scope In: 11:15:33 AM Scope Out: 11:22:19 AM Total Procedure Duration: 0 hours 6 minutes 46 seconds  Findings:      Esophagitis was found. Severe. Confluent and "geographic"       erosions/ulcerations coming up from the GE junction 10 cm. Tubular       esophagus patent throughout its course. No Barrett's epithelium seen.      A medium-sized hiatal hernia was present. Stomach otherwise normal.      The duodenal bulb was normal. 1.5cm soft possible submucosal mass in the       second portion. Otherwise D1 and D2 appeared normal. This area was       biopsied after first biopsy, mass collapse with thick mucinous material       extruding with the biopsy forceps. Dilation performed. Impression:               -Severe ulcerative/erosive reflux esophagitis.                           - Medium-sized hiatal hernia.                           - Normal duodenal bulb. Duodenal mass of uncertain                            significance as described above?"status post biopsy                           - Moderate Sedation:  Moderate (conscious) sedation was administered by the endoscopy nurse       and supervised by the endoscopist. The following parameters were       monitored: oxygen saturation, heart rate, blood pressure, respiratory       rate, EKG, adequacy of pulmonary ventilation, and response to care.       Total physician intraservice time was 13 minutes. Recommendation:           - Patient has a contact number available for                            emergencies. The signs and symptoms of potential                             delayed complications were discussed with the                            patient. Return to normal activities tomorrow.                            Written discharge instructions were provided to the                            patient.                           - Continue present medications. Advance diet.                           - Await pathology results.                           - Return to GI clinic in 12 weeks. Begin Nexium 40                            mg twice daily. Stop Protonix.                           - Patient has a contact number available for                            emergencies. The signs and symptoms of potential                            delayed complications were discussed with the                            patient. Return to normal activities tomorrow.                            Written discharge instructions were provided to the                            patient. Procedure Code(s):        --- Professional ---  22979, Esophagogastroduodenoscopy, flexible,                            transoral; diagnostic, including collection of                            specimen(s) by brushing or washing, when performed                            (separate procedure)                           G0500, Moderate sedation services provided by the                            same physician or other qualified health care                            professional performing a gastrointestinal                            endoscopic service that sedation supports,                            requiring the presence of an independent trained                            observer to assist in the monitoring of the                            patient's level of consciousness and physiological                            status; initial 15 minutes of intra-service time;                            patient age 34 years or older (additional time may                             be reported with [phone], as appropriate) Diagnosis Code(s):        --- Professional ---                           K20.9, Esophagitis, unspecified                           K44.9, Diaphragmatic hernia without obstruction or                            gangrene                           R13.10, Dysphagia, unspecified CPT copyright 2019 American Medical Association. All rights reserved. The codes documented in this report are preliminary and upon coder review may  be revised to meet current compliance requirements. Cristopher Estimable. Gala Romney, MD [place]d  Adi Doro, MD 11/09/2018 11:32:00 AM This report has been signed electronically. Number of Addenda: 0

## 2018-11-10 ENCOUNTER — Telehealth: Payer: Self-pay | Admitting: Internal Medicine

## 2018-11-10 NOTE — Telephone Encounter (Signed)
Pt said she doesn't know the name of the prescription, but it's for reflux. Her insurance doesn't cover it and was there something else for her to take. She uses [facility]. [phone]

## 2018-11-10 NOTE — Telephone Encounter (Signed)
Spoke with pt. Nexium 40 mg bid was prescribed bid at pts EGD. Pt said the pharmacy is going to send info over about the medication not being covered. Will discuss further when I receive the pharmacy info.

## 2018-11-16 ENCOUNTER — Encounter (HOSPITAL_COMMUNITY): Payer: Self-pay | Admitting: Internal Medicine

## 2018-11-17 ENCOUNTER — Encounter: Payer: Self-pay | Admitting: Internal Medicine

## 2018-11-18 ENCOUNTER — Telehealth: Payer: Self-pay

## 2018-11-18 NOTE — Telephone Encounter (Signed)
PA submitted. Waiting on an approval or denial.  

## 2018-11-18 NOTE — Telephone Encounter (Signed)
Patient scheduled.

## 2018-11-18 NOTE — Telephone Encounter (Signed)
Noted. Letter mailed. Nexium PA was submitted through [employer]. Waiting on approval or denial.

## 2018-11-18 NOTE — Telephone Encounter (Signed)
Per RMR-  Send letter to patient.  Send copy of letter with path to referring provider and PCP.  Need ov in 3 months to re-assess and repeat EGD.  We need to make sure she is taking BID Nexium

## 2018-11-23 DIAGNOSIS — Z1231 Encounter for screening mammogram for malignant neoplasm of breast: Secondary | ICD-10-CM | POA: Diagnosis not present

## 2018-11-30 DIAGNOSIS — E1169 Type 2 diabetes mellitus with other specified complication: Secondary | ICD-10-CM | POA: Diagnosis not present

## 2018-11-30 DIAGNOSIS — I10 Essential (primary) hypertension: Secondary | ICD-10-CM | POA: Diagnosis not present

## 2018-11-30 DIAGNOSIS — E1165 Type 2 diabetes mellitus with hyperglycemia: Secondary | ICD-10-CM | POA: Diagnosis not present

## 2018-11-30 DIAGNOSIS — E782 Mixed hyperlipidemia: Secondary | ICD-10-CM | POA: Diagnosis not present

## 2018-12-02 DIAGNOSIS — Z2821 Immunization not carried out because of patient refusal: Secondary | ICD-10-CM | POA: Diagnosis not present

## 2018-12-02 DIAGNOSIS — R300 Dysuria: Secondary | ICD-10-CM | POA: Diagnosis not present

## 2018-12-02 DIAGNOSIS — E782 Mixed hyperlipidemia: Secondary | ICD-10-CM | POA: Diagnosis not present

## 2018-12-02 DIAGNOSIS — I10 Essential (primary) hypertension: Secondary | ICD-10-CM | POA: Diagnosis not present

## 2018-12-02 DIAGNOSIS — E1165 Type 2 diabetes mellitus with hyperglycemia: Secondary | ICD-10-CM | POA: Diagnosis not present

## 2018-12-10 NOTE — Telephone Encounter (Signed)
PA was denied under pts insurance. lmom for pt. Want to offer Good Rx coupon and change pt's pharmacy to make medication more affordable.

## 2018-12-14 NOTE — Telephone Encounter (Signed)
Lmom, waiting on a return call.  

## 2018-12-15 ENCOUNTER — Other Ambulatory Visit: Payer: Self-pay

## 2018-12-15 MED ORDER — ESOMEPRAZOLE MAGNESIUM 40 MG PO CPDR: 40.0000 mg | DELAYED_RELEASE_CAPSULE | Freq: Two times a day (BID) | ORAL | 3 refills | Status: DC

## 2018-12-15 NOTE — Telephone Encounter (Signed)
Pt returned call. PA for Nexium was denied. Pt's pharmacy was switched to [place] and GoodRx coupons given. Pt can get RX at [facility] for $35.61 with GoodRx coupon.

## 2019-01-05 ENCOUNTER — Telehealth: Payer: Self-pay

## 2019-01-05 NOTE — Telephone Encounter (Signed)
Pt walked in office stating her Nexium wasn't sent to [place]. Spoke with the pharmacy pt didn't come to pick medication up. They have put the medication back out for the pt to use. Pt advised to go to the pharmacy.

## 2019-02-09 ENCOUNTER — Ambulatory Visit: Payer: BC Managed Care – PPO | Admitting: Gastroenterology

## 2019-03-15 ENCOUNTER — Encounter: Payer: Self-pay | Admitting: Gastroenterology

## 2019-03-15 NOTE — Progress Notes (Signed)
Primary Care Physician:  Celene Squibb, MD Primary GI:  Garfield Cornea, MD    Patient Location: Home  Provider Location: [facility] office  Reason for Phone Visit: needs upper endoscopy Chief Complaint  Patient presents with  . Dysphagia    trouble swallowing pills/food     Persons present on the phone encounter, with roles: Patient, myself (provider),Martina Booth LPN (updated meds and allergies)  Total time (minutes) spent on medical discussion: 11 minutes  Due to COVID-19, visit was conducted using telephonic method (no video was available).  Visit was requested by patient.  Virtual Visit via Telephone only  I connected with Ms. Boulter on 03/16/19 at 10:30 AM EST by telephone and verified that I am speaking with the correct person using two identifiers.   I discussed the limitations, risks, security and privacy concerns of performing an evaluation and management service by telephone and the availability of in person appointments. I also discussed with the patient that there may be a patient responsible charge related to this service. The patient expressed understanding and agreed to proceed.   HPI:   Patient is a pleasant 63 y/o female who presents for telephone visit regarding need for 3 month follow up upper endoscopy. 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 bx consistent with Brunner's gland. Because of the unusual appearance of the duodenal lesion and severeity of her esophagitis, a 3 month follow up EGD recommended. Next colonoscopy due in 09/2019, 10-year screening.  No heartburn, vomiting. Has trouble getting meat down. Sometimes "coughs" it back up.  Typically notes that after taking several bites, feels like the meat goes down at first but eventually starts getting lodged in the esophagus.  Has some problems swallowing larger pills,  especially her blood pressure pill. No abdominal pain. No melena, brbpr. BM regular.  Is taking Nexium 40 mg twice daily.   Current Outpatient Medications  Medication Sig Dispense Refill  . amLODipine-valsartan (EXFORGE) 10-320 MG tablet Take 1 tablet by mouth daily.   0  . aspirin EC 81 MG tablet Take 81 mg by mouth daily.    Marland Kitchen esomeprazole (NEXIUM) 40 MG capsule Take 1 capsule (40 mg total) by mouth 2 (two) times daily before a meal. 60 capsule 3  . glipiZIDE (GLUCOTROL XL) 10 MG 24 hr tablet Take 10 mg by mouth daily with breakfast.   0  . hydrochlorothiazide (MICROZIDE) 12.5 MG capsule Take 12.5 mg by mouth daily.   0  . metFORMIN (GLUCOPHAGE-XR) 500 MG 24 hr tablet Take 1,000 mg by mouth daily.  5  . pravastatin (PRAVACHOL) 40 MG tablet Take 40 mg by mouth daily.     No current facility-administered medications for this visit.    Past Medical History:  Diagnosis Date  . Diabetes mellitus without complication ([place])   . Elevated cholesterol   . Hypertension   . Sebaceous cyst   . Vaginal discharge 06/22/2015    Past Surgical History:  Procedure Laterality Date  . ABDOMINAL HYSTERECTOMY    . BIOPSY  11/09/2018   Procedure: BIOPSY;  Surgeon: Daneil Dolin, MD;  Location: [facility];  Service: Endoscopy;;  . COLONOSCOPY  2011   Dr. Gala Romney:  normal  . ESOPHAGOGASTRODUODENOSCOPY N/A 11/09/2018   RMR: severe ulcerative/erosive reflux esophagitis, 1.5cm submucosal mass in duodenum with noted mucinous material extruding with biopsy, medium sized hiatal hernia. BX showed Brunner's  gland. 3 month follow up EGD advised.   . right rotator cuff repair      Family History  Problem Relation Age of Onset  . Diabetes Mother   . Hypertension Mother   . Diabetes Sister   . Hypertension Sister   . Colon cancer Neg Hx   . Colon polyps Neg Hx     Social History   Socioeconomic History  . Marital status: Single    Spouse name: Not on file  . Number of children: Not on file  . Years  of education: Not on file  . Highest education level: Not on file  Occupational History  . Not on file  Tobacco Use  . Smoking status: Never Smoker  . Smokeless tobacco: Never Used  Substance and Sexual Activity  . Alcohol use: Not Currently    Comment: once a year will have beer; 03/16/19 denied  . Drug use: No  . Sexual activity: Not Currently    Birth control/protection: Surgical    Comment: hyst  Other Topics Concern  . Not on file  Social History Narrative  . Not on file   Social Determinants of Health   Financial Resource Strain:   . Difficulty of Paying Living Expenses: Not on file  Food Insecurity:   . Worried About Charity fundraiser in the Last Year: Not on file  . Ran Out of Food in the Last Year: Not on file  Transportation Needs:   . Lack of Transportation (Medical): Not on file  . Lack of Transportation (Non-Medical): Not on file  Physical Activity:   . Days of Exercise per Week: Not on file  . Minutes of Exercise per Session: Not on file  Stress:   . Feeling of Stress : Not on file  Social Connections:   . Frequency of Communication with Friends and Family: Not on file  . Frequency of Social Gatherings with Friends and Family: Not on file  . Attends Religious Services: Not on file  . Active Member of Clubs or Organizations: Not on file  . Attends Archivist Meetings: Not on file  . Marital Status: Not on file  Intimate Partner Violence:   . Fear of Current or Ex-Partner: Not on file  . Emotionally Abused: Not on file  . Physically Abused: Not on file  . Sexually Abused: Not on file      ROS:  General: Negative for anorexia, weight loss, fever, chills, fatigue, weakness. Eyes: Negative for vision changes.  ENT: Negative for hoarseness,   nasal congestion.  See HPI CV: Negative for chest pain, angina, palpitations, dyspnea on exertion, peripheral edema.  Respiratory: Negative for dyspnea at rest, dyspnea on exertion, cough, sputum,  wheezing.  GI: See history of present illness. GU:  Negative for dysuria, hematuria, urinary incontinence, urinary frequency, nocturnal urination.  MS: Negative for joint pain, low back pain.  Derm: Negative for rash or itching.  Neuro: Negative for weakness, abnormal sensation, seizure, frequent headaches, memory loss, confusion.  Psych: Negative for anxiety, depression, suicidal ideation, hallucinations.  Endo: Negative for unusual weight change.  Heme: Negative for bruising or bleeding. Allergy: Negative for rash or hives.   Observations/Objective: Pleasant female, no acute distress.  Otherwise exam unavailable.  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  She has been advised to have repeat upper endoscopy due to the unusual nature of the duodenal mass  as well as follow-up on severe ulcerative reflux esophagitis.  Clinically doing well except for persistent dysphagia.  Likely will require esophageal dilation this time.  Dilation not performed last time due to severe ulcerative reflux esophagitis.  She is also due for screening colonoscopy.  She would like to have both done at the same time, especially this will be her second trip to the endoscopy suite since August.  Colonoscopy and upper endoscopy with esophageal dilation with conscious sedation in the near future.  I have discussed the risks, alternatives, benefits with regards to but not limited to the risk of reaction to medication, bleeding, infection, perforation and the patient is agreeable to proceed. Written consent to be obtained.  I have advised that she follow-up with her pharmacist regarding difficulty swallowing her blood pressure pill.  She will discuss with them possibility of crushing medication and taking in applesauce if appropriate.  Continue Nexium 40 mg twice daily before  meals.  Follow Up Instructions:    I discussed the assessment and treatment plan with the patient. The patient was provided an opportunity to ask questions and all were answered. The patient agreed with the plan and demonstrated an understanding of the instructions. AVS mailed to patient's home address.   The patient was advised to call back or seek an in-person evaluation if the symptoms worsen or if the condition fails to improve as anticipated.  I provided 11 minutes of non-face-to-face time during this encounter.   Neil Crouch, PA-C

## 2019-03-16 ENCOUNTER — Encounter: Payer: Self-pay | Admitting: Gastroenterology

## 2019-03-16 ENCOUNTER — Other Ambulatory Visit: Payer: Self-pay

## 2019-03-16 ENCOUNTER — Ambulatory Visit: Payer: BC Managed Care – PPO | Admitting: Gastroenterology

## 2019-03-16 ENCOUNTER — Telehealth: Payer: Self-pay

## 2019-03-16 ENCOUNTER — Ambulatory Visit (INDEPENDENT_AMBULATORY_CARE_PROVIDER_SITE_OTHER): Payer: BC Managed Care – PPO | Admitting: Gastroenterology

## 2019-03-16 DIAGNOSIS — R1319 Other dysphagia: Secondary | ICD-10-CM

## 2019-03-16 DIAGNOSIS — K3189 Other diseases of stomach and duodenum: Secondary | ICD-10-CM | POA: Diagnosis not present

## 2019-03-16 DIAGNOSIS — R1310 Dysphagia, unspecified: Secondary | ICD-10-CM

## 2019-03-16 DIAGNOSIS — K2100 Gastro-esophageal reflux disease with esophagitis, without bleeding: Secondary | ICD-10-CM | POA: Diagnosis not present

## 2019-03-16 MED ORDER — PEG 3350-KCL-NA BICARB-NACL 420 G PO SOLR: 4000.0000 mL | ORAL | 0 refills | Status: DC

## 2019-03-16 NOTE — Patient Instructions (Addendum)
1. Please call your pharmacist and ask whether or not you can crush your large pills and take in applesauce. 2. Colonoscopy and upper endoscopy as scheduled.  Please see separate instructions. 3. Continue Nexium twice daily before breakfast and before evening meal.

## 2019-03-16 NOTE — Telephone Encounter (Signed)
Called pt, TCS/EGD/DIL w/RMR scheduled for 04/12/19 at 7:45am. COVID test 04/11/19 at 9:00am. Orders entered. Rx for prep sent to pharmacy. Appt letter mailed with procedure instructions.

## 2019-03-31 ENCOUNTER — Telehealth: Payer: Self-pay | Admitting: Internal Medicine

## 2019-03-31 NOTE — Telephone Encounter (Signed)
LMOVM patient will need to speak with preservice center at [phone] for how much she will have to pay

## 2019-03-31 NOTE — Telephone Encounter (Signed)
Pt is scheduled procedure with RMR on 04/12/2019 and has questions about her insurance. I asked her if she's called her insurance provider and she said she hasn't had time and wanted to know if her insurance was going to pay for her procedure. [phone]

## 2019-04-11 ENCOUNTER — Other Ambulatory Visit (HOSPITAL_COMMUNITY)
Admission: RE | Admit: 2019-04-11 | Discharge: 2019-04-11 | Disposition: A | Discharge status: Home / Self Care | Payer: BC Managed Care – PPO | Source: Ambulatory Visit | Attending: Internal Medicine | Admitting: Internal Medicine

## 2019-04-11 ENCOUNTER — Other Ambulatory Visit: Payer: Self-pay

## 2019-04-11 ENCOUNTER — Telehealth: Payer: Self-pay

## 2019-04-11 DIAGNOSIS — Z01812 Encounter for preprocedural laboratory examination: Secondary | ICD-10-CM | POA: Diagnosis not present

## 2019-04-11 DIAGNOSIS — E782 Mixed hyperlipidemia: Secondary | ICD-10-CM | POA: Diagnosis not present

## 2019-04-11 DIAGNOSIS — U071 COVID-19: Secondary | ICD-10-CM | POA: Diagnosis not present

## 2019-04-11 DIAGNOSIS — I10 Essential (primary) hypertension: Secondary | ICD-10-CM | POA: Diagnosis not present

## 2019-04-11 DIAGNOSIS — E1169 Type 2 diabetes mellitus with other specified complication: Secondary | ICD-10-CM | POA: Diagnosis not present

## 2019-04-11 DIAGNOSIS — E1165 Type 2 diabetes mellitus with hyperglycemia: Secondary | ICD-10-CM | POA: Diagnosis not present

## 2019-04-11 LAB — SARS CORONAVIRUS 2 (TAT 6-24 HRS): SARS Coronavirus 2: POSITIVE — AB

## 2019-04-11 NOTE — Telephone Encounter (Signed)
Pt called office, she was suppose to start clear liquids after lunch yesterday. However, she didn't start clear liquids and ate eggs yesterday. Advised pt procedure will need to be cancelled. We will call her back to reschedule procedure as she needs to be done soon.  LMOVM for endo scheduler.

## 2019-04-12 ENCOUNTER — Telehealth: Payer: Self-pay | Admitting: Nurse Practitioner

## 2019-04-12 ENCOUNTER — Encounter (HOSPITAL_COMMUNITY): Admission: RE | Payer: Self-pay | Source: Home / Self Care

## 2019-04-12 ENCOUNTER — Ambulatory Visit (HOSPITAL_COMMUNITY)
Admission: RE | Admit: 2019-04-12 | Payer: BC Managed Care – PPO | Source: Home / Self Care | Admitting: Internal Medicine

## 2019-04-12 ENCOUNTER — Telehealth: Payer: Self-pay | Admitting: Gastroenterology

## 2019-04-12 SURGERY — COLONOSCOPY
Anesthesia: Moderate Sedation

## 2019-04-12 NOTE — Telephone Encounter (Signed)
Can she provide a copy of her positive covid test from 03/15/19 (see other telephone note). If she can, then she should be about to have her procedures 21 days after that date.

## 2019-04-12 NOTE — Telephone Encounter (Signed)
04/12/19 1452  I was able to reach the patient.  I informed her of her recent positive SARS-CoV-2 test on 04/11/2019.  Patient reported that she tested positive for Covid on 03/15/2019 outside of the [facility] system.  At that time she did have symptoms such as chills, fever, not feeling well.  She was never hospitalized.  Patient would not be candidate for the outpatient monoclonal antibody infusion as she is not within the window of first 10 days of symptoms from the initial diagnosis of Covid which would have been 03/15/2019.  I explained to the patient that she will continue to have positive Covid test results for up to 90 days from 03/15/2019.  She currently has no symptoms.    Patient aware that she can follow-up with primary care as needed.  We will route message to primary care as well as Dr. Tana Coast, FNP

## 2019-04-12 NOTE — Telephone Encounter (Signed)
Patient called and said that her test was positive-said she was supposed to call the office

## 2019-04-12 NOTE — Telephone Encounter (Addendum)
FYI to LSL.  Magda Paganini, also see telephone note from yesterday.

## 2019-04-12 NOTE — Telephone Encounter (Signed)
Called to Discuss with patient about Covid symptoms and the use of bamlanivimab, a monoclonal antibody infusion for those with mild to moderate Covid symptoms and at a high risk of hospitalization.     Pt is qualified for this infusion at the [facility] infusion center due to co-morbid conditions and/or a member of an at-risk group.     Unable to reach pt  

## 2019-04-12 NOTE — Telephone Encounter (Signed)
Noted, +COVID test 04/11/19. Pt will have to wait at least 21 days from positive test to have procedure done.  Tried to call pt, no answer, LMOVM for return call.

## 2019-04-12 NOTE — Telephone Encounter (Signed)
Anytime.   Karina Quaker FNP

## 2019-04-12 NOTE — Telephone Encounter (Signed)
Thanks Aaron Edelman for the heads up.

## 2019-04-12 NOTE — Telephone Encounter (Signed)
Pt returned call and is aware of her positive results. Pt is aware that she isn't able to have a procedure until 21 days from the positive covid test. Pt states she was told from [facility], that she would have to wait 90 days to have her procedure.

## 2019-04-13 DIAGNOSIS — I10 Essential (primary) hypertension: Secondary | ICD-10-CM | POA: Diagnosis not present

## 2019-04-13 DIAGNOSIS — E1165 Type 2 diabetes mellitus with hyperglycemia: Secondary | ICD-10-CM | POA: Diagnosis not present

## 2019-04-13 DIAGNOSIS — E782 Mixed hyperlipidemia: Secondary | ICD-10-CM | POA: Diagnosis not present

## 2019-04-13 DIAGNOSIS — K219 Gastro-esophageal reflux disease without esophagitis: Secondary | ICD-10-CM | POA: Diagnosis not present

## 2019-04-13 NOTE — Telephone Encounter (Signed)
Called pt, she had COVID test done in [place]. She will see if her daughter can get copy of result for her. Advised her to bring copy of result to our office so it can be scanned into her her chart.

## 2019-04-19 NOTE — Telephone Encounter (Signed)
Pt brought copy of positive COVID test result from 03/15/19 to office yesterday. Result to be scanned into chart.  Tried to call pt to reschedule TCS/EGD/DIL w/RMR, no answer, LMOVM for return cal.

## 2019-04-19 NOTE — Telephone Encounter (Signed)
Pt called office, offered to schedule procedure 05/03/19 but pt unable to do procedure. Procedure scheduled for next available 07/13/19 at 8:15am. COVID test 07/11/19 at 3:00pm.

## 2019-04-20 NOTE — Telephone Encounter (Signed)
Orders entered for procedure. Appt letter and procedure instructions mailed.  FYI to LSL, offered to reschedule procedure 05/03/19 but pt wanted to wait till March. Procedure was scheduled next available.

## 2019-04-20 NOTE — Telephone Encounter (Signed)
noted 

## 2019-07-11 ENCOUNTER — Other Ambulatory Visit (HOSPITAL_COMMUNITY)
Admission: RE | Admit: 2019-07-11 | Discharge: 2019-07-11 | Disposition: A | Discharge status: Home / Self Care | Payer: BC Managed Care – PPO | Source: Ambulatory Visit | Attending: Internal Medicine | Admitting: Internal Medicine

## 2019-07-11 ENCOUNTER — Other Ambulatory Visit: Payer: Self-pay

## 2019-07-11 DIAGNOSIS — Z01812 Encounter for preprocedural laboratory examination: Secondary | ICD-10-CM | POA: Insufficient documentation

## 2019-07-11 DIAGNOSIS — Z20822 Contact with and (suspected) exposure to covid-19: Secondary | ICD-10-CM | POA: Insufficient documentation

## 2019-07-12 LAB — SARS CORONAVIRUS 2 (TAT 6-24 HRS): SARS Coronavirus 2: NEGATIVE

## 2019-07-13 ENCOUNTER — Ambulatory Visit (HOSPITAL_COMMUNITY)
Admission: RE | Admit: 2019-07-13 | Discharge: 2019-07-13 | Disposition: A | Discharge status: Home / Self Care | Payer: BC Managed Care – PPO | Attending: Internal Medicine | Admitting: Internal Medicine

## 2019-07-13 ENCOUNTER — Telehealth: Payer: Self-pay | Admitting: Internal Medicine

## 2019-07-13 ENCOUNTER — Encounter (HOSPITAL_COMMUNITY): Payer: Self-pay | Admitting: Internal Medicine

## 2019-07-13 ENCOUNTER — Encounter (HOSPITAL_COMMUNITY)
Admission: RE | Disposition: A | Discharge status: Home / Self Care | Payer: Self-pay | Source: Home / Self Care | Attending: Internal Medicine

## 2019-07-13 ENCOUNTER — Other Ambulatory Visit: Payer: Self-pay | Admitting: Internal Medicine

## 2019-07-13 ENCOUNTER — Other Ambulatory Visit: Payer: Self-pay

## 2019-07-13 DIAGNOSIS — K635 Polyp of colon: Secondary | ICD-10-CM

## 2019-07-13 DIAGNOSIS — K2100 Gastro-esophageal reflux disease with esophagitis, without bleeding: Secondary | ICD-10-CM | POA: Insufficient documentation

## 2019-07-13 DIAGNOSIS — E119 Type 2 diabetes mellitus without complications: Secondary | ICD-10-CM | POA: Diagnosis not present

## 2019-07-13 DIAGNOSIS — Z7984 Long term (current) use of oral hypoglycemic drugs: Secondary | ICD-10-CM | POA: Diagnosis not present

## 2019-07-13 DIAGNOSIS — I10 Essential (primary) hypertension: Secondary | ICD-10-CM | POA: Diagnosis not present

## 2019-07-13 DIAGNOSIS — Z7982 Long term (current) use of aspirin: Secondary | ICD-10-CM | POA: Diagnosis not present

## 2019-07-13 DIAGNOSIS — R1319 Other dysphagia: Secondary | ICD-10-CM

## 2019-07-13 DIAGNOSIS — E7800 Pure hypercholesterolemia, unspecified: Secondary | ICD-10-CM | POA: Insufficient documentation

## 2019-07-13 DIAGNOSIS — R1310 Dysphagia, unspecified: Secondary | ICD-10-CM | POA: Insufficient documentation

## 2019-07-13 DIAGNOSIS — Z79899 Other long term (current) drug therapy: Secondary | ICD-10-CM | POA: Diagnosis not present

## 2019-07-13 DIAGNOSIS — Z1211 Encounter for screening for malignant neoplasm of colon: Secondary | ICD-10-CM | POA: Diagnosis not present

## 2019-07-13 HISTORY — PX: COLONOSCOPY: SHX5424

## 2019-07-13 HISTORY — PX: POLYPECTOMY: SHX5525

## 2019-07-13 HISTORY — PX: ESOPHAGOGASTRODUODENOSCOPY: SHX5428

## 2019-07-13 LAB — GLUCOSE, CAPILLARY: Glucose-Capillary: 125 mg/dL — ABNORMAL HIGH (ref 70–99)

## 2019-07-13 SURGERY — COLONOSCOPY
Anesthesia: Moderate Sedation

## 2019-07-13 MED ORDER — MIDAZOLAM HCL 5 MG/5ML IJ SOLN
INTRAMUSCULAR | Status: DC | PRN
  Administered 2019-07-13: 1 mg via INTRAVENOUS
  Administered 2019-07-13: 2 mg via INTRAVENOUS
  Administered 2019-07-13 (×2): 1 mg via INTRAVENOUS

## 2019-07-13 MED ORDER — LIDOCAINE VISCOUS HCL 2 % MT SOLN
OROMUCOSAL | Status: AC
  Filled 2019-07-13: qty 15

## 2019-07-13 MED ORDER — MEPERIDINE HCL 100 MG/ML IJ SOLN
INTRAMUSCULAR | Status: DC | PRN
  Administered 2019-07-13: 25 mg via INTRAVENOUS
  Administered 2019-07-13: 15 mg via INTRAVENOUS
  Administered 2019-07-13: 10 mg via INTRAVENOUS

## 2019-07-13 MED ORDER — ONDANSETRON HCL 4 MG/2ML IJ SOLN
INTRAMUSCULAR | Status: DC | PRN
  Administered 2019-07-13: 4 mg via INTRAVENOUS

## 2019-07-13 MED ORDER — STERILE WATER FOR IRRIGATION IR SOLN
Status: DC | PRN
  Administered 2019-07-13: 1.5 mL

## 2019-07-13 MED ORDER — LIDOCAINE VISCOUS HCL 2 % MT SOLN
OROMUCOSAL | Status: DC | PRN
  Administered 2019-07-13: 1 via OROMUCOSAL

## 2019-07-13 MED ORDER — MIDAZOLAM HCL 5 MG/5ML IJ SOLN
INTRAMUSCULAR | Status: AC
  Filled 2019-07-13: qty 10

## 2019-07-13 MED ORDER — SODIUM CHLORIDE 0.9 % IV SOLN: INTRAVENOUS | Status: DC

## 2019-07-13 MED ORDER — MEPERIDINE HCL 50 MG/ML IJ SOLN
INTRAMUSCULAR | Status: AC
  Filled 2019-07-13: qty 1

## 2019-07-13 MED ORDER — ONDANSETRON HCL 4 MG/2ML IJ SOLN
INTRAMUSCULAR | Status: AC
  Filled 2019-07-13: qty 2

## 2019-07-13 NOTE — H&P (Signed)
@LOGO @   Primary Care Physician:  Celene Squibb, MD Primary Gastroenterologist:  Dr. Gala Romney  Pre-Procedure History & Physical: HPI:  Karina Kirk is a 64 y.o. female here for for EGD to further evaluate esophageal dysphagia.  History of severe GERD.  Well-controlled on omeprazole 40 mg daily.  Also, desirous of average risk 10-year screening colonoscopy at this time.  Past Medical History:  Diagnosis Date  . Diabetes mellitus without complication ([place])   . Elevated cholesterol   . Hypertension   . Sebaceous cyst   . Vaginal discharge 06/22/2015    Past Surgical History:  Procedure Laterality Date  . ABDOMINAL HYSTERECTOMY    . BIOPSY  11/09/2018   Procedure: BIOPSY;  Surgeon: Daneil Dolin, MD;  Location[facility];  Service: Endoscopy;;  . COLONOSCOPY  2011   Dr. Gala Romney:  normal  . ESOPHAGOGASTRODUODENOSCOPY N/A 11/09/2018   RMR: severe ulcerative/erosive reflux esophagitis, 1.5cm submucosal mass in duodenum with noted mucinous material extruding with biopsy, medium sized hiatal hernia. BX showed Brunner's gland. 3 month follow up EGD advised.   . right rotator cuff repair      Prior to Admission medications   Medication Sig Start Date End Date Taking? Authorizing Provider  amLODipine-valsartan (EXFORGE) 5-320 MG tablet Take 1 tablet by mouth daily. 02/01/19  Yes [provider]  esomeprazole (NEXIUM) 40 MG capsule Take 1 capsule (40 mg total) by mouth 2 (two) times daily before a meal. Patient taking differently: Take 40 mg by mouth daily.  12/15/18  Yes [facility] [place], Cristopher Estimable, MD  hydrochlorothiazide (MICROZIDE) 12.5 MG capsule Take 12.5 mg by mouth daily.  05/21/15  Yes [provider]  metFORMIN (GLUCOPHAGE-XR) 500 MG 24 hr tablet Take 500 mg by mouth 2 (two) times daily.  01/05/18  Yes [provider]  polyethylene glycol-electrolytes (TRILYTE) 420 g solution Take 4,000 mLs by mouth as directed. 03/16/19  Yes Mod[facility] Sammons, Cristopher Estimable, MD  pravastatin (PRAVACHOL)  40 MG tablet Take 40 mg by mouth daily. 08/09/18  Yes [provider]  aspirin EC 81 MG tablet Take 81 mg by mouth 2 (two) times a week.     [provider]    Allergies as of 04/20/2019 - Review Complete 04/08/2019  Allergen Reaction Noted  . Sulfa antibiotics Rash 10/26/2010    Family History  Problem Relation Age of Onset  . Diabetes Mother   . Hypertension Mother   . Diabetes Sister   . Hypertension Sister   . Colon cancer Neg Hx   . Colon polyps Neg Hx     Social History   Socioeconomic History  . Marital status: Single    Spouse name: Not on file  . Number of children: Not on file  . Years of education: Not on file  . Highest education level: Not on file  Occupational History  . Not on file  Tobacco Use  . Smoking status: Never Smoker  . Smokeless tobacco: Never Used  Substance and Sexual Activity  . Alcohol use: Not Currently    Comment: once a year will have beer; 03/16/19 denied  . Drug use: No  . Sexual activity: Not Currently    Birth control/protection: Surgical    Comment: hyst  Other Topics Concern  . Not on file  Social History Narrative  . Not on file   Social Determinants of Health   Financial Resource Strain:   . Difficulty of Paying Living Expenses:   Food Insecurity:   . Worried  About Running Out of Food in the Last Year:   . [place] in the Last Year:   Transportation Needs:   . Lack of Transportation (Medical):   Marland Kitchen Lack of Transportation (Non-Medical):   Physical Activity:   . Days of Exercise per Week:   . Minutes of Exercise per Session:   Stress:   . Feeling of Stress :   Social Connections:   . Frequency of Communication with Friends and Family:   . Frequency of Social Gatherings with Friends and Family:   . Attends Religious Services:   . Active Member of Clubs or Organizations:   . Attends Archivist Meetings:   Marland Kitchen Marital Status:   Intimate Partner Violence:   . Fear of Current or  Ex-Partner:   . Emotionally Abused:   Marland Kitchen Physically Abused:   . Sexually Abused:     Review of Systems: See HPI, otherwise negative ROS  Physical Exam: BP 131/82   Pulse 74   Temp 97.6 F (36.4 C) (Oral)   Resp 14   Ht 5\' 6"  (1.676 m)   Wt 71.7 kg   SpO2 100%   BMI 25.50 kg/m  General:   Alert,  Well-developed, well-nourished, pleasant and cooperative in NAD Neck:  Supple; no masses or thyromegaly. No significant cervical adenopathy. Lungs:  Clear throughout to auscultation.   No wheezes, crackles, or rhonchi. No acute distress. Heart:  Regular rate and rhythm; no murmurs, clicks, rubs,  or gallops. Abdomen: Non-distended, normal bowel sounds.  Soft and nontender without appreciable mass or hepatosplenomegaly.  Pulses:  Normal pulses noted. Extremities:  Without clubbing or edema.  Impression/Plan: 65 year old lady with longstanding GERD/history of reflux esophagitis.  Chronic esophageal dysphagia.  For EGD with possible esophageal dilation as feasible /appropriate per plan today.  Also, here for 10-year average rescreening colonoscopy.  The risks, benefits, limitations, imponderables and alternatives regarding both EGD and colonoscopy have been reviewed with the patient. Questions have been answered. All parties agreeable.      Notice: This dictation was prepared with Dragon dictation along with smaller phrase technology. Any transcriptional errors that result from this process are unintentional and may not be corrected upon review.

## 2019-07-13 NOTE — Telephone Encounter (Signed)
See other phone note

## 2019-07-13 NOTE — Discharge Instructions (Signed)
Colon Polyps  Polyps are tissue growths inside the body. Polyps can grow in many places, including the large intestine (colon). A polyp may be a round bump or a mushroom-shaped growth. You could have one polyp or several. Most colon polyps are noncancerous (benign). However, some colon polyps can become cancerous over time. Finding and removing the polyps early can help prevent this. What are the causes? The exact cause of colon polyps is not known. What increases the risk? You are more likely to develop this condition if you: Have a family history of colon cancer or colon polyps. Are older than 40 or older than 45 if you are African American. Have inflammatory bowel disease, such as ulcerative colitis or Crohn's disease. Have certain hereditary conditions, such as: Familial adenomatous polyposis. Lynch syndrome. Turcot syndrome. Peutz-Jeghers syndrome. Are overweight. Smoke cigarettes. Do not get enough exercise. Drink too much alcohol. Eat a diet that is high in fat and red meat and low in fiber. Had childhood cancer that was treated with abdominal radiation. What are the signs or symptoms? Most polyps do not cause symptoms. If you have symptoms, they may include: Blood coming from your rectum when having a bowel movement. Blood in your stool. The stool may look dark red or black. Abdominal pain. A change in bowel habits, such as constipation or diarrhea. How is this diagnosed? This condition is diagnosed with a colonoscopy. This is a procedure in which a lighted, flexible scope is inserted into the anus and then passed into the colon to examine the area. Polyps are sometimes found when a colonoscopy is done as part of routine cancer screening tests. How is this treated? Treatment for this condition involves removing any polyps that are found. Most polyps can be removed during a colonoscopy. Those polyps will then be tested for cancer. Additional treatment may be needed depending on  the results of testing. Follow these instructions at home: Lifestyle Maintain a healthy weight, or lose weight if recommended by your health care provider. Exercise every day or as told by your health care provider. Do not use any products that contain nicotine or tobacco, such as cigarettes and e-cigarettes. If you need help quitting, ask your health care provider. If you drink alcohol, limit how much you have: 0-1 drink a day for women. 0-2 drinks a day for men. Be aware of how much alcohol is in your drink. In the [place] one drink equals one 12 oz bottle of beer (355 mL), one 5 oz glass of wine (148 mL), or one 1 oz shot of hard liquor (44 mL). Eating and drinking  Eat foods that are high in fiber, such as fruits, vegetables, and whole grains. Eat foods that are high in calcium and vitamin D, such as milk, cheese, yogurt, eggs, liver, fish, and broccoli. Limit foods that are high in fat, such as fried foods and desserts. Limit the amount of red meat and processed meat you eat, such as hot dogs, sausage, bacon, and lunch meats. General instructions Keep all follow-up visits as told by your health care provider. This is important. This includes having regularly scheduled colonoscopies. Talk to your health care provider about when you need a colonoscopy. Contact a health care provider if: You have new or worsening bleeding during a bowel movement. You have new or increased blood in your stool. You have a change in bowel habits. You lose weight for no known reason. Summary Polyps are tissue growths inside the body. Polyps can grow in  many places, including the colon. Most colon polyps are noncancerous (benign), but some can become cancerous over time. This condition is diagnosed with a colonoscopy. Treatment for this condition involves removing any polyps that are found. Most polyps can be removed during a colonoscopy. This information is not intended to replace advice given to you by  your health care provider. Make sure you discuss any questions you have with your health care provider. Document Revised: 06/25/2017 Document Reviewed: 06/25/2017 Elsevier Patient Education  [place].  Colonoscopy Discharge Instructions  Read the instructions outlined below and refer to this sheet in the next few weeks. These discharge instructions provide you with general information on caring for yourself after you leave the hospital. Your doctor may also give you specific instructions. While your treatment has been planned according to the most current medical practices available, unavoidable complications occasionally occur. If you have any problems or questions after discharge, call Dr. Gala Romney at [phone]. ACTIVITY  You may resume your regular activity, but move at a slower pace for the next 24 hours.   Take frequent rest periods for the next 24 hours.   Walking will help get rid of the air and reduce the bloated feeling in your belly (abdomen).   No driving for 24 hours (because of the medicine (anesthesia) used during the test).    Do not sign any important legal documents or operate any machinery for 24 hours (because of the anesthesia used during the test).  NUTRITION  Drink plenty of fluids.   You may resume your normal diet as instructed by your doctor.   Begin with a light meal and progress to your normal diet. Heavy or fried foods are harder to digest and may make you feel sick to your stomach (nauseated).   Avoid alcoholic beverages for 24 hours or as instructed.  MEDICATIONS  You may resume your normal medications unless your doctor tells you otherwise.  WHAT YOU CAN EXPECT TODAY  Some feelings of bloating in the abdomen.   Passage of more gas than usual.   Spotting of blood in your stool or on the toilet paper.  IF YOU HAD POLYPS REMOVED DURING THE COLONOSCOPY:  No aspirin products for 7 days or as instructed.   No alcohol for 7 days or as  instructed.   Eat a soft diet for the next 24 hours.  FINDING OUT THE RESULTS OF YOUR TEST Not all test results are available during your visit. If your test results are not back during the visit, make an appointment with your caregiver to find out the results. Do not assume everything is normal if you have not heard from your caregiver or the medical facility. It is important for you to follow up on all of your test results.  SEEK IMMEDIATE MEDICAL ATTENTION IF:  You have more than a spotting of blood in your stool.   Your belly is swollen (abdominal distention).   You are nauseated or vomiting.   You have a temperature over 101.   You have abdominal pain or discomfort that is severe or gets worse throughout the day.   EGD Discharge instructions Please read the instructions outlined below and refer to this sheet in the next few weeks. These discharge instructions provide you with general information on caring for yourself after you leave the hospital. Your doctor may also give you specific instructions. While your treatment has been planned according to the most current medical practices available, unavoidable complications occasionally occur. If  you have any problems or questions after discharge, please call your doctor. ACTIVITY  You may resume your regular activity but move at a slower pace for the next 24 hours.   Take frequent rest periods for the next 24 hours.   Walking will help expel (get rid of) the air and reduce the bloated feeling in your abdomen.   No driving for 24 hours (because of the anesthesia (medicine) used during the test).   You may shower.   Do not sign any important legal documents or operate any machinery for 24 hours (because of the anesthesia used during the test).  NUTRITION  Drink plenty of fluids.   You may resume your normal diet.   Begin with a light meal and progress to your normal diet.   Avoid alcoholic beverages for 24 hours or as  instructed by your caregiver.  MEDICATIONS  You may resume your normal medications unless your caregiver tells you otherwise.  WHAT YOU CAN EXPECT TODAY  You may experience abdominal discomfort such as a feeling of fullness or "gas" pains.  FOLLOW-UP  Your doctor will discuss the results of your test with you.  SEEK IMMEDIATE MEDICAL ATTENTION IF ANY OF THE FOLLOWING OCCUR:  Excessive nausea (feeling sick to your stomach) and/or vomiting.   Severe abdominal pain and distention (swelling).   Trouble swallowing.   Temperature over 101 F (37.8 C).   Rectal bleeding or vomiting of blood.   Colon polyp information provided  Further recommendations to follow pending review of pathology report  Unable to pass scope into your esophagus due to anatomy.  We will need to get a barium x-ray of your throat and esophagus to further evaluate  Please schedule a barium pill esophagram dysphagia  At patient request, I called Jazmen Polynice daughter at [phone] rolled immediately to voicemail.  Left a message with results.

## 2019-07-13 NOTE — Op Note (Signed)
[facility] Patient Name: Karina Kirk Procedure Date: 07/13/2019 8:42 AM MRN: [id number] Date of Birth: 12-09-1955 Attending MD: Norvel Richards , MD CSN: [id number] Age: 64 Admit Type: Outpatient Procedure:                Colonoscopy Indications:              Screening for colorectal malignant neoplasm Providers:                Norvel Richards, MD, Charlsie Quest. Theda Sers RN, RN,                            Raphael Gibney, Technician Referring MD:              Medicines:                Midazolam 6 mg IV, Meperidine 50 mg IV, Ondansetron                            4 mg IV Complications:            No immediate complications. Estimated Blood Loss:     Estimated blood loss was minimal. Procedure:                Pre-Anesthesia Assessment:                           - Prior to the procedure, a History and Physical                            was performed, and patient medications and                            allergies were reviewed. The patient's tolerance of                            previous anesthesia was also reviewed. The risks                            and benefits of the procedure and the sedation                            options and risks were discussed with the patient.                            All questions were answered, and informed consent                            was obtained. Prior Anticoagulants: The patient has                            taken no previous anticoagulant or antiplatelet                            agents. ASA Grade Assessment: II - A patient with  mild systemic disease. After reviewing the risks                            and benefits, the patient was deemed in                            satisfactory condition to undergo the procedure.                           After obtaining informed consent, the colonoscope                            was passed under direct vision. Throughout the   procedure, the patient's blood pressure, pulse, and                            oxygen saturations were monitored continuously. The                            [id number] scope was introduced through                            the anus and advanced to the the cecum, identified                            by appendiceal orifice and ileocecal valve. The                            colonoscopy was performed without difficulty. The                            patient tolerated the procedure well. The quality                            of the bowel preparation was adequate. Scope In: 8:49:32 AM Scope Out: 9:06:11 AM Scope Withdrawal Time: 0 hours 6 minutes 33 seconds  Total Procedure Duration: 0 hours 16 minutes 39 seconds  Findings:      The perianal and digital rectal examinations were normal.      A 5 mm polyp was found in the sigmoid colon. The polyp was sessile. The       polyp was removed with a cold snare. Resection and retrieval were       complete. Estimated blood loss was minimal.      The exam was otherwise without abnormality on direct and retroflexion       views. Impression:               - One 5 mm polyp in the sigmoid colon, removed with                            a cold snare. Resected and retrieved.                           - The examination was otherwise normal on direct  and retroflexion views. Moderate Sedation:      Moderate (conscious) sedation was administered by the endoscopy nurse       and supervised by the endoscopist. The following parameters were       monitored: oxygen saturation, heart rate, blood pressure, respiratory       rate, EKG, adequacy of pulmonary ventilation, and response to care.       Total physician intraservice time was 42 minutes. Recommendation:           - Patient has a contact number available for                            emergencies. The signs and symptoms of potential                            delayed  complications were discussed with the                            patient. Return to normal activities tomorrow.                            Written discharge instructions were provided to the                            patient.                           - Resume previous diet.                           - Continue present medications.                           - Repeat colonoscopy date to be determined after                            pending pathology results are reviewed for                            surveillance based on pathology results.                           - Return to GI office (date not yet determined).                            See EGD report Procedure Code(s):        --- Professional ---                           [phone], Colonoscopy, flexible; with removal of                            tumor(s), polyp(s), or other lesion(s) by snare                            technique  [id number], Moderate sedation; each additional 15                            minutes intraservice time                           [id number], Moderate sedation; each additional 15                            minutes intraservice time                           G0500, Moderate sedation services provided by the                            same physician or other qualified health care                            professional performing a gastrointestinal                            endoscopic service that sedation supports,                            requiring the presence of an independent trained                            observer to assist in the monitoring of the                            patient's level of consciousness and physiological                            status; initial 15 minutes of intra-service time;                            patient age 46 years or older (additional time may                            be reported with [phone], as appropriate) Diagnosis Code(s):        ---  Professional ---                           Z12.11, Encounter for screening for malignant                            neoplasm of colon                           K63.5, Polyp of colon CPT copyright 2019 American Medical Association. All rights reserved. The codes documented in this report are preliminary and upon coder review may  be revised to meet current compliance requirements. Cristopher Estimable. Amiel Mccaffrey, MD Norvel Richards, MD 07/13/2019 9:09:40 AM This report has been signed electronically. Number of Addenda: 0

## 2019-07-13 NOTE — Telephone Encounter (Signed)
Anitra from Short Stay called to say that the patient needs a Barium Pill Esophagram for dysphagia scheduled. [phone]

## 2019-07-13 NOTE — Op Note (Addendum)
[facility] Patient Name: Karina Kirk Procedure Date: 07/13/2019 8:07 AM MRN: [id number] Date of Birth: March 17, 1956 Attending MD: Norvel Richards , MD CSN: [id number] Age: 64 Admit Type: Outpatient Procedure:                Upper GI endoscopy Indications:              Dysphagia Providers:                Norvel Richards, MD, Charlsie Quest. Theda Sers RN, RN,                            Raphael Gibney, Technician Referring MD:              Medicines:                Midazolam mg 4 IV, Meperidine 40 mg IV Complications:            No immediate complications. Estimated Blood Loss:     Estimated blood loss: none. Procedure:                Pre-Anesthesia Assessment:                           - Prior to the procedure, a History and Physical                            was performed, and patient medications and                            allergies were reviewed. The patient's tolerance of                            previous anesthesia was also reviewed. The risks                            and benefits of the procedure and the sedation                            options and risks were discussed with the patient.                            All questions were answered, and informed consent                            was obtained. Prior Anticoagulants: The patient has                            taken no previous anticoagulant or antiplatelet                            agents. ASA Grade Assessment: II - A patient with                            mild systemic disease. After reviewing the risks  and benefits, the patient was deemed in                            satisfactory condition to undergo the procedure.                           After obtaining informed consent, the endoscope was                            passed under direct vision. Throughout the                            procedure, the patient's blood pressure, pulse, and                            oxygen  saturations were monitored continuously. The                            [id number]) scope was introduced through the                            mouth, with the intention of advancing to the                            duodenum. The scope was advanced to the                            cricopharyngeal esophagus before the procedure was                            aborted. Medications were given. The upper GI                            endoscopy was performed with moderate difficulty                            due to abnormal anatomy. Scope In: 8:29:50 AM Scope Out: 8:39:14 AM Total Procedure Duration: 0 hours 9 minutes 24 seconds  Findings:      Multiple attempts at intubating the esophagus led to a blind pouch in       the cricopharyngeus. Appearance of a Zenker's diverticulum. Unable to       see esophageal lumen/intubate esophageal lumen. Because I kept running       into an apparent pouch directly I did not attempt indirect intubation of       the esophagus. Procedure aborted. Impression:               -Unable to intubate the esophagus. Query Zenker's                            diverticulum. EGD in August of last year                            accomplished - no difficulty intubating the  esophagus at that time. Patient had no dysphagia at                            that time. Moderate Sedation:      Moderate (conscious) sedation was administered by the endoscopy nurse       and supervised by the endoscopist. The following parameters were       monitored: oxygen saturation, heart rate, blood pressure, respiratory       rate, EKG, adequacy of pulmonary ventilation, and response to care. Recommendation:           - Patient has a contact number available for                            emergencies. The signs and symptoms of potential                            delayed complications were discussed with the                            patient. Return to normal  activities tomorrow.                            Written discharge instructions were provided to the                            patient.                           - Advance diet as tolerated. Barium pill esophagram                            in the near future. Further recommendations to                            follow. See colonoscopy report                           - Continue present medications. Procedure Code(s):        --- Professional ---                           [phone], 52, Esophagogastroduodenoscopy, flexible,                            transoral; diagnostic, including collection of                            specimen(s) by brushing or washing, when performed                            (separate procedure) Diagnosis Code(s):        --- Professional ---                           R13.10, Dysphagia, unspecified CPT copyright 2019 American Medical Association. All rights reserved. The codes documented  in this report are preliminary and upon coder review may  be revised to meet current compliance requirements. Karina Kirk. Karina Krontz, MD Norvel Richards, MD 07/13/2019 8:47:58 AM This report has been signed electronically. Number of Addenda: 0

## 2019-07-13 NOTE — Telephone Encounter (Signed)
[phone] PATIENT RETURNED CALL, PLEASE CALL BACK

## 2019-07-13 NOTE — Telephone Encounter (Signed)
Spoke to pt, informed her of BPE appt.

## 2019-07-13 NOTE — Telephone Encounter (Signed)
BPE scheduled for 4/26 mon at 9:30am, arrival 9:15am, npo 3 hrs prior  LMOVM for pt to call back

## 2019-07-14 ENCOUNTER — Encounter: Payer: Self-pay | Admitting: Internal Medicine

## 2019-07-14 LAB — SURGICAL PATHOLOGY

## 2019-07-18 ENCOUNTER — Other Ambulatory Visit: Payer: Self-pay

## 2019-07-18 ENCOUNTER — Ambulatory Visit (HOSPITAL_COMMUNITY)
Admission: RE | Admit: 2019-07-18 | Discharge: 2019-07-18 | Disposition: A | Discharge status: Home / Self Care | Payer: BC Managed Care – PPO | Source: Home / Self Care | Attending: Internal Medicine | Admitting: Internal Medicine

## 2019-07-18 DIAGNOSIS — K449 Diaphragmatic hernia without obstruction or gangrene: Secondary | ICD-10-CM | POA: Diagnosis not present

## 2019-07-18 DIAGNOSIS — K2100 Gastro-esophageal reflux disease with esophagitis, without bleeding: Secondary | ICD-10-CM | POA: Diagnosis not present

## 2019-07-18 DIAGNOSIS — R1310 Dysphagia, unspecified: Secondary | ICD-10-CM

## 2019-07-18 DIAGNOSIS — R1319 Other dysphagia: Secondary | ICD-10-CM

## 2019-07-18 DIAGNOSIS — K635 Polyp of colon: Secondary | ICD-10-CM | POA: Diagnosis not present

## 2019-07-18 DIAGNOSIS — Z1211 Encounter for screening for malignant neoplasm of colon: Secondary | ICD-10-CM | POA: Diagnosis not present

## 2019-07-18 DIAGNOSIS — E119 Type 2 diabetes mellitus without complications: Secondary | ICD-10-CM | POA: Diagnosis not present

## 2019-07-18 DIAGNOSIS — K225 Diverticulum of esophagus, acquired: Secondary | ICD-10-CM | POA: Diagnosis not present

## 2019-07-18 DIAGNOSIS — I10 Essential (primary) hypertension: Secondary | ICD-10-CM | POA: Diagnosis not present

## 2019-07-18 DIAGNOSIS — Z7984 Long term (current) use of oral hypoglycemic drugs: Secondary | ICD-10-CM | POA: Diagnosis not present

## 2019-07-18 DIAGNOSIS — Z79899 Other long term (current) drug therapy: Secondary | ICD-10-CM | POA: Diagnosis not present

## 2019-07-18 DIAGNOSIS — Z7982 Long term (current) use of aspirin: Secondary | ICD-10-CM | POA: Diagnosis not present

## 2019-07-18 DIAGNOSIS — E7800 Pure hypercholesterolemia, unspecified: Secondary | ICD-10-CM | POA: Diagnosis not present

## 2019-07-20 ENCOUNTER — Other Ambulatory Visit: Payer: Self-pay

## 2019-07-20 DIAGNOSIS — K225 Diverticulum of esophagus, acquired: Secondary | ICD-10-CM

## 2019-08-26 ENCOUNTER — Ambulatory Visit (INDEPENDENT_AMBULATORY_CARE_PROVIDER_SITE_OTHER): Payer: BC Managed Care – PPO | Admitting: Otolaryngology

## 2019-08-26 ENCOUNTER — Encounter (INDEPENDENT_AMBULATORY_CARE_PROVIDER_SITE_OTHER): Payer: Self-pay | Admitting: Otolaryngology

## 2019-08-26 ENCOUNTER — Other Ambulatory Visit: Payer: Self-pay

## 2019-08-26 VITALS — Temp 97.9°F

## 2019-08-26 DIAGNOSIS — K225 Diverticulum of esophagus, acquired: Secondary | ICD-10-CM

## 2019-08-26 NOTE — Progress Notes (Signed)
HPI: Karina Kirk is a 64 y.o. female who presents is referred by Roseanne Kaufman, NP for evaluation of Zenker's diverticulum.  Patient has had some intermittent dysphagia with food getting caught in her throat and sometimes she has regurgitation.  She has had no aspiration or pneumonia.  She has not had any significant weight loss. She underwent a barium swallow that demonstrated a small to moderate sized Zenker's diverticulum measuring approximately 2 cm in size.  Apparently a 12.5 mm capsule got lodged in this area but eventually passed. Patient presents today with her daughter..  Past Medical History:  Diagnosis Date  . Diabetes mellitus without complication ([place])   . Elevated cholesterol   . Hypertension   . Sebaceous cyst   . Vaginal discharge 06/22/2015   Past Surgical History:  Procedure Laterality Date  . ABDOMINAL HYSTERECTOMY    . BIOPSY  11/09/2018   Procedure: BIOPSY;  Surgeon: Daneil Dolin, MD;  [facility]: [facility];  Service: Endoscopy;;  . COLONOSCOPY  2011   Dr. Gala Romney:  normal  . COLONOSCOPY N/A 07/13/2019   Procedure: COLONOSCOPY;  Surgeon: Daneil Dolin, MD;  Location: [facility];  Service: Endoscopy;  Laterality: N/A;  8:15am  . ESOPHAGOGASTRODUODENOSCOPY N/A 11/09/2018   RMR: severe ulcerative/erosive reflux esophagitis, 1.5cm submucosal mass in duodenum with noted mucinous material extruding with biopsy, medium sized hiatal hernia. BX showed Brunner's gland. 3 month follow up EGD advised.   . ESOPHAGOGASTRODUODENOSCOPY N/A 07/13/2019   Procedure: ESOPHAGOGASTRODUODENOSCOPY (EGD);  Surgeon: Daneil Dolin, MD;  [facility]: [facility];  Service: Endoscopy;  Laterality: N/A;  . POLYPECTOMY  07/13/2019   Procedure: POLYPECTOMY;  Surgeon: Daneil Dolin, MD;  Location: [facility];  Service: Endoscopy;;  . right rotator cuff repair     Social History   Socioeconomic History  . Marital status: Single    Spouse name: Not on file  . Number of children: Not on  file  . Years of education: Not on file  . Highest education level: Not on file  Occupational History  . Not on file  Tobacco Use  . Smoking status: Never Smoker  . Smokeless tobacco: Never Used  Substance and Sexual Activity  . Alcohol use: Not Currently    Comment: once a year will have beer; 03/16/19 denied  . Drug use: No  . Sexual activity: Not Currently    Birth control/protection: Surgical    Comment: hyst  Other Topics Concern  . Not on file  Social History Narrative  . Not on file   Social Determinants of Health   Financial Resource Strain:   . Difficulty of Paying Living Expenses:   Food Insecurity:   . Worried About Charity fundraiser in the Last Year:   . Arboriculturist in the Last Year:   Transportation Needs:   . Film/video editor (Medical):   Marland Kitchen Lack of Transportation (Non-Medical):   Physical Activity:   . Days of Exercise per Week:   . Minutes of Exercise per Session:   Stress:   . Feeling of Stress :   Social Connections:   . Frequency of Communication with Friends and Family:   . Frequency of Social Gatherings with Friends and Family:   . Attends Religious Services:   . Active Member of Clubs or Organizations:   . Attends Archivist Meetings:   Marland Kitchen Marital Status:    Family History  Problem Relation Age of Onset  . Diabetes  Mother   . Hypertension Mother   . Diabetes Sister   . Hypertension Sister   . Colon cancer Neg Hx   . Colon polyps Neg Hx    Allergies  Allergen Reactions  . Sulfa Antibiotics Rash   Prior to Admission medications   Medication Sig Start Date End Date Taking? Authorizing Provider  amLODipine-valsartan (EXFORGE) 5-320 MG tablet Take 1 tablet by mouth daily. 02/01/19  Yes [provider]  aspirin EC 81 MG tablet Take 81 mg by mouth 2 (two) times a week.    Yes [provider]  esomeprazole (NEXIUM) 40 MG capsule TAKE 1 CAPSULE BY MOUTH TWICE DAILY BEFORE MEAL(S) 07/13/19  Yes Carlis Stable,  NP  hydrochlorothiazide (MICROZIDE) 12.5 MG capsule Take 12.5 mg by mouth daily.  05/21/15  Yes [provider]  metFORMIN (GLUCOPHAGE-XR) 500 MG 24 hr tablet Take 500 mg by mouth 2 (two) times daily.  01/05/18  Yes [provider]  polyethylene glycol-electrolytes (TRILYTE) 420 g solution Take 4,000 mLs by mouth as directed. 03/16/19  Yes Rourk, Cristopher Estimable, MD  pravastatin (PRAVACHOL) 40 MG tablet Take 40 mg by mouth daily. 08/09/18  Yes [provider]     Positive ROS: Otherwise negative  All other systems have been reviewed and were otherwise negative with the exception of those mentioned in the HPI and as above.  Physical Exam: Constitutional: Alert, well-appearing, no acute distress Ears: External ears without lesions or tenderness. Ear canals are clear bilaterally with intact, clear TMs.  Nasal: External nose without lesions.. Clear nasal passages Oral: Lips and gums without lesions. Tongue and palate mucosa without lesions. Posterior oropharynx clear. Neck: No palpable adenopathy or masses.  No palpable masses in the neck. Respiratory: Breathing comfortably  Skin: No facial/neck lesions or rash noted.  Procedures  Assessment: Small to medium size Zenker's diverticulum approximately 2 cm in size reviewed on recent barium swallow.  Plan: Presently the Zenker's is on the smaller size for adequately performing endoscopic stapling of the Zenker's.  Zenker diverticulum does not extend to the distal end of the cricopharyngeus muscle which is usually divided with stapling of the diverticulum. Reviewed the findings with the patient as well as her daughter in the office today. If her swallowing becomes worse or the stickers enlarges I would consider endoscopic stapling of the diverticulum.  Perhaps repeat barium swallow in 1 year. I am not sure at this point her present symptoms warrant the risk involved with surgical intervention.   Radene Journey, MD   CC:

## 2019-09-26 DIAGNOSIS — E1165 Type 2 diabetes mellitus with hyperglycemia: Secondary | ICD-10-CM | POA: Diagnosis not present

## 2019-09-26 DIAGNOSIS — E1169 Type 2 diabetes mellitus with other specified complication: Secondary | ICD-10-CM | POA: Diagnosis not present

## 2019-09-26 DIAGNOSIS — E782 Mixed hyperlipidemia: Secondary | ICD-10-CM | POA: Diagnosis not present

## 2019-09-26 DIAGNOSIS — I10 Essential (primary) hypertension: Secondary | ICD-10-CM | POA: Diagnosis not present

## 2019-09-28 DIAGNOSIS — K219 Gastro-esophageal reflux disease without esophagitis: Secondary | ICD-10-CM | POA: Diagnosis not present

## 2019-09-28 DIAGNOSIS — E1165 Type 2 diabetes mellitus with hyperglycemia: Secondary | ICD-10-CM | POA: Diagnosis not present

## 2019-09-28 DIAGNOSIS — E782 Mixed hyperlipidemia: Secondary | ICD-10-CM | POA: Diagnosis not present

## 2019-09-28 DIAGNOSIS — I10 Essential (primary) hypertension: Secondary | ICD-10-CM | POA: Diagnosis not present

## 2020-01-02 DIAGNOSIS — E782 Mixed hyperlipidemia: Secondary | ICD-10-CM | POA: Diagnosis not present

## 2020-01-02 DIAGNOSIS — I10 Essential (primary) hypertension: Secondary | ICD-10-CM | POA: Diagnosis not present

## 2020-01-02 DIAGNOSIS — E1169 Type 2 diabetes mellitus with other specified complication: Secondary | ICD-10-CM | POA: Diagnosis not present

## 2020-01-02 DIAGNOSIS — Z2821 Immunization not carried out because of patient refusal: Secondary | ICD-10-CM | POA: Diagnosis not present

## 2020-01-02 DIAGNOSIS — Z6828 Body mass index (BMI) 28.0-28.9, adult: Secondary | ICD-10-CM | POA: Diagnosis not present

## 2020-01-02 DIAGNOSIS — R110 Nausea: Secondary | ICD-10-CM | POA: Diagnosis not present

## 2020-01-02 DIAGNOSIS — E1165 Type 2 diabetes mellitus with hyperglycemia: Secondary | ICD-10-CM | POA: Diagnosis not present

## 2020-01-05 DIAGNOSIS — K219 Gastro-esophageal reflux disease without esophagitis: Secondary | ICD-10-CM | POA: Diagnosis not present

## 2020-01-05 DIAGNOSIS — E1165 Type 2 diabetes mellitus with hyperglycemia: Secondary | ICD-10-CM | POA: Diagnosis not present

## 2020-01-05 DIAGNOSIS — E782 Mixed hyperlipidemia: Secondary | ICD-10-CM | POA: Diagnosis not present

## 2020-01-05 DIAGNOSIS — I10 Essential (primary) hypertension: Secondary | ICD-10-CM | POA: Diagnosis not present

## 2020-07-12 DIAGNOSIS — L02411 Cutaneous abscess of right axilla: Secondary | ICD-10-CM | POA: Diagnosis not present

## 2020-07-26 DIAGNOSIS — L02411 Cutaneous abscess of right axilla: Secondary | ICD-10-CM | POA: Diagnosis not present

## 2020-07-26 DIAGNOSIS — E1169 Type 2 diabetes mellitus with other specified complication: Secondary | ICD-10-CM | POA: Diagnosis not present

## 2020-08-21 DIAGNOSIS — I10 Essential (primary) hypertension: Secondary | ICD-10-CM | POA: Diagnosis not present

## 2020-08-21 DIAGNOSIS — E1169 Type 2 diabetes mellitus with other specified complication: Secondary | ICD-10-CM | POA: Diagnosis not present

## 2020-08-23 DIAGNOSIS — E782 Mixed hyperlipidemia: Secondary | ICD-10-CM | POA: Diagnosis not present

## 2020-08-23 DIAGNOSIS — E1165 Type 2 diabetes mellitus with hyperglycemia: Secondary | ICD-10-CM | POA: Diagnosis not present

## 2020-08-23 DIAGNOSIS — I10 Essential (primary) hypertension: Secondary | ICD-10-CM | POA: Diagnosis not present

## 2020-08-23 DIAGNOSIS — J309 Allergic rhinitis, unspecified: Secondary | ICD-10-CM | POA: Diagnosis not present

## 2020-12-31 DIAGNOSIS — E1165 Type 2 diabetes mellitus with hyperglycemia: Secondary | ICD-10-CM | POA: Diagnosis not present

## 2021-02-19 ENCOUNTER — Other Ambulatory Visit (HOSPITAL_COMMUNITY): Payer: Self-pay | Admitting: Preventative Medicine

## 2021-02-19 DIAGNOSIS — S2241XA Multiple fractures of ribs, right side, initial encounter for closed fracture: Secondary | ICD-10-CM

## 2021-02-22 ENCOUNTER — Encounter (HOSPITAL_COMMUNITY): Payer: Self-pay

## 2021-02-22 ENCOUNTER — Encounter (HOSPITAL_COMMUNITY): Payer: Worker's Compensation

## 2021-03-01 ENCOUNTER — Encounter (HOSPITAL_COMMUNITY): Payer: Self-pay

## 2021-03-01 ENCOUNTER — Encounter (HOSPITAL_COMMUNITY)
Admission: RE | Admit: 2021-03-01 | Discharge: 2021-03-01 | Disposition: A | Discharge status: Home / Self Care | Payer: Worker's Compensation | Source: Ambulatory Visit | Attending: Preventative Medicine | Admitting: Preventative Medicine

## 2021-03-01 ENCOUNTER — Other Ambulatory Visit: Payer: Self-pay

## 2021-03-01 ENCOUNTER — Encounter (HOSPITAL_COMMUNITY)
Admission: RE | Admit: 2021-03-01 | Discharge: 2021-03-01 | Disposition: A | Discharge status: Home / Self Care | Payer: BC Managed Care – PPO | Source: Ambulatory Visit | Attending: Preventative Medicine | Admitting: Preventative Medicine

## 2021-03-01 DIAGNOSIS — S2241XA Multiple fractures of ribs, right side, initial encounter for closed fracture: Secondary | ICD-10-CM | POA: Insufficient documentation

## 2021-03-01 MED ORDER — TECHNETIUM TC 99M MEDRONATE IV KIT
20.0000 | PACK | Freq: Once | INTRAVENOUS | Status: AC | PRN
  Administered 2021-03-01: 21 via INTRAVENOUS

## 2021-04-12 DIAGNOSIS — Z1231 Encounter for screening mammogram for malignant neoplasm of breast: Secondary | ICD-10-CM | POA: Diagnosis not present

## 2021-06-05 ENCOUNTER — Ambulatory Visit: Payer: BC Managed Care – PPO | Admitting: Adult Health

## 2021-06-05 ENCOUNTER — Encounter: Payer: Self-pay | Admitting: Adult Health

## 2021-06-05 ENCOUNTER — Other Ambulatory Visit: Payer: Self-pay

## 2021-06-05 VITALS — BP 136/87 | HR 72 | Ht 64.0 in | Wt 154.5 lb

## 2021-06-05 DIAGNOSIS — R319 Hematuria, unspecified: Secondary | ICD-10-CM | POA: Diagnosis not present

## 2021-06-05 DIAGNOSIS — R8290 Unspecified abnormal findings in urine: Secondary | ICD-10-CM | POA: Diagnosis not present

## 2021-06-05 DIAGNOSIS — N390 Urinary tract infection, site not specified: Secondary | ICD-10-CM | POA: Insufficient documentation

## 2021-06-05 DIAGNOSIS — N898 Other specified noninflammatory disorders of vagina: Secondary | ICD-10-CM | POA: Insufficient documentation

## 2021-06-05 LAB — POCT URINALYSIS DIPSTICK
Glucose, UA: POSITIVE — AB
Ketones, UA: NEGATIVE
Leukocytes, UA: NEGATIVE
Nitrite, UA: POSITIVE
Protein, UA: NEGATIVE

## 2021-06-05 MED ORDER — NYSTATIN-TRIAMCINOLONE 100000-0.1 UNIT/GM-% EX OINT: 1.0000 "application " | TOPICAL_OINTMENT | Freq: Two times a day (BID) | CUTANEOUS | 1 refills | Status: DC

## 2021-06-05 MED ORDER — NITROFURANTOIN MONOHYD MACRO 100 MG PO CAPS: 100.0000 mg | ORAL_CAPSULE | Freq: Two times a day (BID) | ORAL | 0 refills | Status: DC

## 2021-06-05 MED ORDER — FLUCONAZOLE 150 MG PO TABS: ORAL_TABLET | ORAL | 1 refills | Status: DC

## 2021-06-05 NOTE — Progress Notes (Signed)
?Subjective:  ?  ? Patient ID: Karina Kirk, female   DOB: 02/24/1956, 66 y.o.   MRN: [id number] ? ?HPI ?[name] is a 66 year old black female,single, sp hysterectomy in complaining of odor in urine and has vaginal itching,denies any discharge, and not having sex. ?PCP is Dr Nevada Crane. ? ?Review of Systems ?Has odor in urine ?Has had itching in vaginal area, esp labia, used wash cloth to rub ?Denies any vaginal discharge  ?Not having sex ?She says metformin upsets her GI system ?Reviewed past medical,surgical, social and family history. Reviewed medications and allergies.  ?   ?Objective:  ? Physical Exam ?BP 136/87 (BP Location: Left Arm, Patient Position: Sitting, Cuff Size: Normal)   Pulse 72   Ht '5\' 4"'$  (1.626 m)   Wt 154 lb 8 oz (70.1 kg)   BMI 26.52 kg/m?  urine 1+glucose,trace blood, + nitrates ?  Skin warm and dry.  Lungs: clear to ausculation bilaterally. Cardiovascular: regular rate and rhythm.  ?Pelvic: external genitalia is normal in appearance no lesions,has minute white discharge under clitoral hood, vagina:atrophic,no discharge without odor,urethra has no lesions or masses noted, cervix and uterus absent,adnexa: no masses or tenderness noted. Bladder is non tender and no masses felt.  ?Fall risk is moderate ? Upstream - 06/05/21 1010   ? ?  ? Pregnancy Intention Screening  ? Does the patient want to become pregnant in the next year? N/A   ? Does the patient's partner want to become pregnant in the next year? N/A   ? Would the patient like to discuss contraceptive options today? N/A   ?  ? Contraception Wrap Up  ? Current Method Female Sterilization   hyst  ? End Method Female Sterilization   hyst  ? Contraception Counseling Provided No   ? ?  ?  ? ?  ? Co exam with Balinda Quails NP student ?Assessment:  ?   ?1. Abnormal urine odor ?Will send urine for UA C&S  ?- POCT Urinalysis Dipstick ?- Urine Culture ?- Urinalysis, Routine w reflex microscopic ? ?2. Hematuria, unspecified type ?Will send urine for UA  C&S to rule out UTI ?- POCT Urinalysis Dipstick ?- Urine Culture ?- Urinalysis, Routine w reflex microscopic ? ?3. Vaginal itching ?Will rx mycolog ointment to use on labia and clit area ?And will rx diflucan, in case it is yeast based  ? ?4. Urinary tract infection with hematuria, site unspecified ?Will rx Macrobid for suspected UTI ?Meds ordered this encounter  ?Medications  ? nitrofurantoin, macrocrystal-monohydrate, (MACROBID) 100 MG capsule  ?  Sig: Take 1 capsule (100 mg total) by mouth 2 (two) times daily.  ?  Dispense:  14 capsule  ?  Refill:  0  ?  Order Specific Question:   Supervising Provider  ?  Answer:   Tania Ade H [[id number]]  ? fluconazole (DIFLUCAN) 150 MG tablet  ?  Sig: Take 1 now and repeat 1 in 72 hours, do not take Pravachol while taking diflucan  ?  Dispense:  2 tablet  ?  Refill:  1  ?  Order Specific Question:   Supervising Provider  ?  Answer:   Tania Ade H [[id number]]  ? nystatin-triamcinolone ointment (MYCOLOG)  ?  Sig: Apply 1 application. topically 2 (two) times daily.  ?  Dispense:  30 g  ?  Refill:  1  ?  Order Specific Question:   Supervising Provider  ?  Answer:   Tania Ade H [[id number]]  ?  ?   ?  Plan:  ?   ?Follow up prn  ?   ?

## 2021-06-06 LAB — URINALYSIS, ROUTINE W REFLEX MICROSCOPIC
Bilirubin, UA: NEGATIVE
Ketones, UA: NEGATIVE
Nitrite, UA: POSITIVE — AB
Specific Gravity, UA: >=1.03 — AB (ref 1.005–1.030)
Urobilinogen, Ur: 0.2 mg/dL (ref 0.2–1.0)
pH, UA: 5 (ref 5.0–7.5)

## 2021-06-06 LAB — MICROSCOPIC EXAMINATION
Casts: NONE SEEN /lpf
RBC, Urine: NONE SEEN /hpf (ref 0–2)

## 2021-06-08 LAB — URINE CULTURE

## 2021-07-30 ENCOUNTER — Other Ambulatory Visit: Payer: Self-pay | Admitting: Orthopedic Surgery

## 2021-07-30 DIAGNOSIS — G8929 Other chronic pain: Secondary | ICD-10-CM

## 2021-08-20 DIAGNOSIS — E1165 Type 2 diabetes mellitus with hyperglycemia: Secondary | ICD-10-CM | POA: Diagnosis not present

## 2021-08-24 DIAGNOSIS — E782 Mixed hyperlipidemia: Secondary | ICD-10-CM | POA: Diagnosis not present

## 2021-08-24 DIAGNOSIS — I10 Essential (primary) hypertension: Secondary | ICD-10-CM | POA: Diagnosis not present

## 2021-08-24 DIAGNOSIS — E1165 Type 2 diabetes mellitus with hyperglycemia: Secondary | ICD-10-CM | POA: Diagnosis not present

## 2021-08-24 DIAGNOSIS — J309 Allergic rhinitis, unspecified: Secondary | ICD-10-CM | POA: Diagnosis not present

## 2021-08-26 ENCOUNTER — Other Ambulatory Visit: Payer: BC Managed Care – PPO

## 2021-08-26 ENCOUNTER — Encounter (HOSPITAL_COMMUNITY): Payer: Self-pay | Admitting: *Deleted

## 2021-08-26 ENCOUNTER — Other Ambulatory Visit: Payer: Self-pay

## 2021-08-26 ENCOUNTER — Emergency Department (HOSPITAL_COMMUNITY)
Admission: EM | Admit: 2021-08-26 | Discharge: 2021-08-26 | Disposition: A | Discharge status: Home / Self Care | Payer: Worker's Compensation | Attending: Emergency Medicine | Admitting: Emergency Medicine

## 2021-08-26 ENCOUNTER — Inpatient Hospital Stay: Admission: RE | Admit: 2021-08-26 | Payer: BC Managed Care – PPO | Source: Ambulatory Visit

## 2021-08-26 DIAGNOSIS — I10 Essential (primary) hypertension: Secondary | ICD-10-CM | POA: Diagnosis not present

## 2021-08-26 DIAGNOSIS — E119 Type 2 diabetes mellitus without complications: Secondary | ICD-10-CM | POA: Diagnosis not present

## 2021-08-26 DIAGNOSIS — M545 Low back pain, unspecified: Secondary | ICD-10-CM | POA: Diagnosis present

## 2021-08-26 DIAGNOSIS — Z7982 Long term (current) use of aspirin: Secondary | ICD-10-CM | POA: Diagnosis not present

## 2021-08-26 DIAGNOSIS — Z79899 Other long term (current) drug therapy: Secondary | ICD-10-CM | POA: Diagnosis not present

## 2021-08-26 DIAGNOSIS — Z7984 Long term (current) use of oral hypoglycemic drugs: Secondary | ICD-10-CM | POA: Insufficient documentation

## 2021-08-26 MED ORDER — DIAZEPAM 5 MG PO TABS: 5.0000 mg | ORAL_TABLET | Freq: Three times a day (TID) | ORAL | 0 refills | Status: AC | PRN

## 2021-08-26 NOTE — ED Triage Notes (Signed)
Pt c/o lower back pain x 2 days ago; pt states the pain is better with walking

## 2021-08-26 NOTE — ED Provider Notes (Signed)
[facility] EMERGENCY DEPARTMENT Provider Note   CSN: [id number] Arrival date & time: 08/26/21  1014     History  Chief Complaint  Patient presents with   Back Pain    Karina Kirk is a 66 y.o. female.  HPI  Medical history including hypertension, diabetes presents with complaints of left lower back pain.  Pain started about 1 week ago, started after she was getting out of the tub.  States she has pain in her left lower back does not radiate remains 1 area is worsening when she is sitting or goes from a sitting to same position, improves with ambulation, denies any paresthesias or weakness moving down her leg saddle paresthesias bowel or urinary incontinency.  States she has been taking a muscle relaxer as well as getting back injection without much relief.  She states that the steroid injections have increased her blood sugar.  She has no acute urinary symptoms no stomach pain no nausea or vomiting.  She has no other complaints.    Home Medications Prior to Admission medications   Medication Sig Start Date End Date Taking? Authorizing Provider  diazepam (VALIUM) 5 MG tablet Take 1 tablet (5 mg total) by mouth every 8 (eight) hours as needed for up to 4 days for anxiety. 08/26/21 08/30/21 Yes Marcello Fennel, PA-C  amLODipine-valsartan (EXFORGE) 5-320 MG tablet Take 1 tablet by mouth daily. 02/01/19   [provider]  aspirin EC 81 MG tablet Take 81 mg by mouth 2 (two) times a week.     [provider]  fluconazole (DIFLUCAN) 150 MG tablet Take 1 now and repeat 1 in 72 hours, do not take Pravachol while taking diflucan 06/05/21   Derrek Monaco A, NP  hydrochlorothiazide (MICROZIDE) 12.5 MG capsule Take 12.5 mg by mouth daily.  05/21/15   [provider]  metFORMIN (GLUCOPHAGE-XR) 500 MG 24 hr tablet Take 500 mg by mouth 2 (two) times daily.  01/05/18   [provider]  nitrofurantoin, macrocrystal-monohydrate, (MACROBID) 100 MG capsule Take 1  capsule (100 mg total) by mouth 2 (two) times daily. 06/05/21   Estill Dooms, NP  nystatin-triamcinolone ointment [facility]) Apply 1 application. topically 2 (two) times daily. 06/05/21   Estill Dooms, NP  pravastatin (PRAVACHOL) 40 MG tablet Take 40 mg by mouth daily. 08/09/18   [provider]      Allergies    Sulfa antibiotics    Review of Systems   Review of Systems  Constitutional:  Negative for chills and fever.  Respiratory:  Negative for shortness of breath.   Cardiovascular:  Negative for chest pain.  Gastrointestinal:  Negative for abdominal pain.  Musculoskeletal:  Positive for back pain.  Neurological:  Negative for headaches.   Physical Exam Updated Vital Signs BP (!) 134/116   Pulse 79   Temp 98.3 F (36.8 C) (Oral)   Resp 20   Ht '5\' 4"'$  (1.626 m)   Wt 71.2 kg   SpO2 99%   BMI 26.95 kg/m  Physical Exam Vitals and nursing note reviewed.  Constitutional:      General: She is not in acute distress.    Appearance: She is not ill-appearing.  HENT:     Head: Normocephalic and atraumatic.     Nose: No congestion.  Eyes:     Conjunctiva/sclera: Conjunctivae normal.  Cardiovascular:     Rate and Rhythm: Normal rate and regular rhythm.     Pulses: Normal pulses.     Heart  sounds: No murmur heard.   No friction rub. No gallop.  Pulmonary:     Effort: No respiratory distress.  Abdominal:     Palpations: Abdomen is soft.     Tenderness: There is no abdominal tenderness. There is no right CVA tenderness or left CVA tenderness.  Musculoskeletal:     Comments: Spine was palpated was nontender to palpation no step-off deformities noted, skin changes, but notable tenderness within the muscles on the left iliac crest, she had 5-5 strength neurovascular intact in the lower extremities, 2+ dorsal pedal pulses, positive left leg raise.  Is able to ambulate.  Skin:    General: Skin is warm and dry.  Neurological:     Mental Status: She is alert.   Psychiatric:        Mood and Affect: Mood normal.    ED Results / Procedures / Treatments   Labs (all labs ordered are listed, but only abnormal results are displayed) Labs Reviewed - No data to display  EKG None  Radiology No results found.  Procedures Procedures    Medications Ordered in ED Medications - No data to display  ED Course/ Medical Decision Making/ A&P                           Medical Decision Making  This patient presents to the ED for concern of back pain, this involves an extensive number of treatment options, and is a complaint that carries with it a high risk of complications and morbidity.  The differential diagnosis includes fracture, dislocation, spinal equina, AAA    Additional history obtained:  Additional history obtained from N/A External records from outside source obtained and reviewed including previous PCP notes, ER notes medications, medical history   Co morbidities that complicate the patient evaluation  Diabetes  Social Determinants of Health:  N/A    Lab Tests:  I Ordered, and personally interpreted labs.  The pertinent results include: N/A   Imaging Studies ordered:  I ordered imaging studies including N/A I independently visualized and interpreted imaging which showed N/A I agree with the radiologist interpretation   Cardiac Monitoring:  The patient was maintained on a cardiac monitor.  I personally viewed and interpreted the cardiac monitored which showed an underlying rhythm of: N/A   Medicines ordered and prescription drug management:  I ordered medication including N/A I have reviewed the patients home medicines and have made adjustments as needed  Critical Interventions:  N/A   Reevaluation:  Presents with back pain, benign physical exam, she is agreement with plan and discharge at this time.  Consultations Obtained:  N/A   Test Considered:  CT lumbar spine-deferred as my suspicion for  fracture or dislocation very low at this time there is no traumatic injury associate this pain, she is at low risk for pathological fractures.    Rule out I have low suspicion for spinal fracture or spinal cord abnormality as patient denies urinary incontinency, retention, difficulty with bowel movements, denies saddle paresthesias.  Spine was palpated there is no step-off, crepitus or gross deformities felt, patient had 5/5 strength, full range of motion, neurovascular fully intact in the lower extremities.  Low suspicion for UTI, pyelo-, kidney stone denies any urinary symptoms, no flank tenderness, no suprapubic pain.  Low suspicion for AAA/dissection presentation is atypical etiology, pain is localized reproducible worsened with movement. Low suspicion for septic arthritis as patient denies IV drug use, skin exam was performed no  erythematous, edema or warm joints noted.     Dispostion and problem list  After consideration of the diagnostic results and the patients response to treatment, I feel that the patent would benefit from discharge.  Back pain-likely acute on chronic, due to her diabetes will defer on systemic steroids, will place her on Valium muscle tension follow-up with neurosurgery for further evaluation.            Final Clinical Impression(s) / ED Diagnoses Final diagnoses:  Acute left-sided low back pain without sciatica    Rx / DC Orders ED Discharge Orders          Ordered    diazepam (VALIUM) 5 MG tablet  Every 8 hours PRN        08/26/21 1144              Marcello Fennel, PA-C 08/26/21 1145    Godfrey Pick, MD 08/27/21 [phone]

## 2021-08-26 NOTE — Discharge Instructions (Signed)
You have been seen here for back pain. I recommend taking over-the-counter pain medications like ibuprofen and/or Tylenol every 6 as needed.  Please follow dosage and on the back of bottle.  I also recommend applying heat to the area and stretching out the muscles as this will help decrease stiffness and pain.  I have given you information on exercises please follow.  Please follow-up with your neurosurgeon for further evaluation  Come back to the emergency department if you develop chest pain, shortness of breath, severe abdominal pain, uncontrolled nausea, vomiting, diarrhea.

## 2021-09-19 ENCOUNTER — Ambulatory Visit
Admission: RE | Admit: 2021-09-19 | Discharge: 2021-09-19 | Disposition: A | Discharge status: Home / Self Care | Payer: Worker's Compensation | Source: Ambulatory Visit | Attending: Orthopedic Surgery | Admitting: Orthopedic Surgery

## 2021-09-19 DIAGNOSIS — M545 Low back pain, unspecified: Secondary | ICD-10-CM

## 2022-01-27 DIAGNOSIS — M545 Low back pain, unspecified: Secondary | ICD-10-CM | POA: Diagnosis not present

## 2022-02-18 DIAGNOSIS — E1165 Type 2 diabetes mellitus with hyperglycemia: Secondary | ICD-10-CM | POA: Diagnosis not present

## 2022-03-26 DIAGNOSIS — J309 Allergic rhinitis, unspecified: Secondary | ICD-10-CM | POA: Diagnosis not present

## 2022-03-26 DIAGNOSIS — E782 Mixed hyperlipidemia: Secondary | ICD-10-CM | POA: Diagnosis not present

## 2022-03-26 DIAGNOSIS — I10 Essential (primary) hypertension: Secondary | ICD-10-CM | POA: Diagnosis not present

## 2022-03-26 DIAGNOSIS — E1165 Type 2 diabetes mellitus with hyperglycemia: Secondary | ICD-10-CM | POA: Diagnosis not present

## 2022-04-18 DIAGNOSIS — R92333 Mammographic heterogeneous density, bilateral breasts: Secondary | ICD-10-CM | POA: Diagnosis not present

## 2022-04-18 DIAGNOSIS — Z1231 Encounter for screening mammogram for malignant neoplasm of breast: Secondary | ICD-10-CM | POA: Diagnosis not present

## 2022-07-03 DIAGNOSIS — E782 Mixed hyperlipidemia: Secondary | ICD-10-CM | POA: Diagnosis not present

## 2022-07-03 DIAGNOSIS — E1165 Type 2 diabetes mellitus with hyperglycemia: Secondary | ICD-10-CM | POA: Diagnosis not present

## 2022-07-09 DIAGNOSIS — E782 Mixed hyperlipidemia: Secondary | ICD-10-CM | POA: Diagnosis not present

## 2022-07-09 DIAGNOSIS — J309 Allergic rhinitis, unspecified: Secondary | ICD-10-CM | POA: Diagnosis not present

## 2022-07-09 DIAGNOSIS — I10 Essential (primary) hypertension: Secondary | ICD-10-CM | POA: Diagnosis not present

## 2022-07-09 DIAGNOSIS — E1169 Type 2 diabetes mellitus with other specified complication: Secondary | ICD-10-CM | POA: Diagnosis not present

## 2022-11-25 DIAGNOSIS — E782 Mixed hyperlipidemia: Secondary | ICD-10-CM | POA: Diagnosis not present

## 2022-11-25 DIAGNOSIS — E1169 Type 2 diabetes mellitus with other specified complication: Secondary | ICD-10-CM | POA: Diagnosis not present

## 2023-01-19 DIAGNOSIS — Z2820 Immunization not carried out because of patient decision for unspecified reason: Secondary | ICD-10-CM | POA: Diagnosis not present

## 2023-01-19 DIAGNOSIS — Z6827 Body mass index (BMI) 27.0-27.9, adult: Secondary | ICD-10-CM | POA: Diagnosis not present

## 2023-01-19 DIAGNOSIS — E663 Overweight: Secondary | ICD-10-CM | POA: Diagnosis not present

## 2023-01-19 DIAGNOSIS — E782 Mixed hyperlipidemia: Secondary | ICD-10-CM | POA: Diagnosis not present

## 2023-01-19 DIAGNOSIS — K225 Diverticulum of esophagus, acquired: Secondary | ICD-10-CM | POA: Diagnosis not present

## 2023-01-19 DIAGNOSIS — J309 Allergic rhinitis, unspecified: Secondary | ICD-10-CM | POA: Diagnosis not present

## 2023-01-19 DIAGNOSIS — K7689 Other specified diseases of liver: Secondary | ICD-10-CM | POA: Diagnosis not present

## 2023-01-19 DIAGNOSIS — E1169 Type 2 diabetes mellitus with other specified complication: Secondary | ICD-10-CM | POA: Diagnosis not present

## 2023-01-19 DIAGNOSIS — I10 Essential (primary) hypertension: Secondary | ICD-10-CM | POA: Diagnosis not present

## 2023-04-23 DIAGNOSIS — Z1231 Encounter for screening mammogram for malignant neoplasm of breast: Secondary | ICD-10-CM | POA: Diagnosis not present

## 2023-04-23 DIAGNOSIS — R92333 Mammographic heterogeneous density, bilateral breasts: Secondary | ICD-10-CM | POA: Diagnosis not present

## 2023-06-10 DIAGNOSIS — E119 Type 2 diabetes mellitus without complications: Secondary | ICD-10-CM | POA: Diagnosis not present

## 2023-06-10 DIAGNOSIS — H524 Presbyopia: Secondary | ICD-10-CM | POA: Diagnosis not present

## 2023-06-11 DIAGNOSIS — E782 Mixed hyperlipidemia: Secondary | ICD-10-CM | POA: Diagnosis not present

## 2023-06-11 DIAGNOSIS — E1169 Type 2 diabetes mellitus with other specified complication: Secondary | ICD-10-CM | POA: Diagnosis not present

## 2023-06-17 DIAGNOSIS — E1169 Type 2 diabetes mellitus with other specified complication: Secondary | ICD-10-CM | POA: Diagnosis not present

## 2023-06-17 DIAGNOSIS — I10 Essential (primary) hypertension: Secondary | ICD-10-CM | POA: Diagnosis not present

## 2023-06-17 DIAGNOSIS — J309 Allergic rhinitis, unspecified: Secondary | ICD-10-CM | POA: Diagnosis not present

## 2023-06-17 DIAGNOSIS — K225 Diverticulum of esophagus, acquired: Secondary | ICD-10-CM | POA: Diagnosis not present

## 2023-06-17 DIAGNOSIS — E782 Mixed hyperlipidemia: Secondary | ICD-10-CM | POA: Diagnosis not present

## 2023-06-17 DIAGNOSIS — K7689 Other specified diseases of liver: Secondary | ICD-10-CM | POA: Diagnosis not present

## 2023-06-17 DIAGNOSIS — E663 Overweight: Secondary | ICD-10-CM | POA: Diagnosis not present

## 2023-09-24 DIAGNOSIS — L2989 Other pruritus: Secondary | ICD-10-CM | POA: Diagnosis not present

## 2023-09-24 DIAGNOSIS — R8290 Unspecified abnormal findings in urine: Secondary | ICD-10-CM | POA: Diagnosis not present

## 2023-09-24 DIAGNOSIS — L299 Pruritus, unspecified: Secondary | ICD-10-CM | POA: Diagnosis not present

## 2023-10-19 DIAGNOSIS — E1169 Type 2 diabetes mellitus with other specified complication: Secondary | ICD-10-CM | POA: Diagnosis not present

## 2023-10-19 DIAGNOSIS — E782 Mixed hyperlipidemia: Secondary | ICD-10-CM | POA: Diagnosis not present

## 2023-10-23 DIAGNOSIS — I10 Essential (primary) hypertension: Secondary | ICD-10-CM | POA: Diagnosis not present

## 2023-10-23 DIAGNOSIS — E1165 Type 2 diabetes mellitus with hyperglycemia: Secondary | ICD-10-CM | POA: Diagnosis not present

## 2023-10-23 DIAGNOSIS — E1169 Type 2 diabetes mellitus with other specified complication: Secondary | ICD-10-CM | POA: Diagnosis not present

## 2023-10-23 DIAGNOSIS — M25512 Pain in left shoulder: Secondary | ICD-10-CM | POA: Diagnosis not present

## 2023-10-23 DIAGNOSIS — G8929 Other chronic pain: Secondary | ICD-10-CM | POA: Diagnosis not present

## 2023-10-23 DIAGNOSIS — E1159 Type 2 diabetes mellitus with other circulatory complications: Secondary | ICD-10-CM | POA: Diagnosis not present

## 2023-10-23 DIAGNOSIS — K225 Diverticulum of esophagus, acquired: Secondary | ICD-10-CM | POA: Diagnosis not present

## 2023-10-23 DIAGNOSIS — E782 Mixed hyperlipidemia: Secondary | ICD-10-CM | POA: Diagnosis not present

## 2023-10-23 DIAGNOSIS — K7689 Other specified diseases of liver: Secondary | ICD-10-CM | POA: Diagnosis not present

## 2023-11-02 ENCOUNTER — Encounter: Payer: Self-pay | Admitting: Adult Health

## 2023-11-02 ENCOUNTER — Ambulatory Visit: Admitting: Adult Health

## 2023-11-02 VITALS — BP 122/75 | HR 69 | Ht 64.0 in | Wt 156.0 lb

## 2023-11-02 DIAGNOSIS — Z90710 Acquired absence of both cervix and uterus: Secondary | ICD-10-CM | POA: Diagnosis not present

## 2023-11-02 DIAGNOSIS — R8290 Unspecified abnormal findings in urine: Secondary | ICD-10-CM

## 2023-11-02 DIAGNOSIS — R319 Hematuria, unspecified: Secondary | ICD-10-CM | POA: Diagnosis not present

## 2023-11-02 LAB — POCT URINALYSIS DIPSTICK
Glucose, UA: NEGATIVE
Ketones, UA: NEGATIVE
Leukocytes, UA: NEGATIVE
Nitrite, UA: NEGATIVE
Protein, UA: NEGATIVE

## 2023-11-02 MED ORDER — NITROFURANTOIN MONOHYD MACRO 100 MG PO CAPS: 100.0000 mg | ORAL_CAPSULE | Freq: Two times a day (BID) | ORAL | 0 refills | Status: DC

## 2023-11-02 NOTE — Progress Notes (Signed)
  Subjective:     Patient ID: Karina Kirk, female   DOB: 11/03/1955, 68 y.o.   MRN: [id number]  HPI Karina Kirk is a 68 year old black female,single, sp hysterectomy in complaining of abnormal odor in urine for 1-2 weeks.  PCP is Dr Shona.  Review of Systems +abnormal odor in urine Denies any sex, itching or burning, or pain when pees Reviewed past medical,surgical, social and family history. Reviewed medications and allergies.     Objective:   Physical Exam BP 122/75 (BP Location: Right Arm, Patient Position: Sitting, Cuff Size: Normal)   Pulse 69   Ht 5' 4 (1.626 m)   Wt 156 lb (70.8 kg)   BMI 26.78 kg/m  urine dipstick Treace blood. Skin warm and dry.Pelvic: external genitalia is normal in appearance no lesions, vagina: pale, no odor or discharge,urethra has no lesions or masses noted, cervix and uterus are absent,adnexa: no masses or tenderness noted. Bladder is non tender and no masses felt.    Fall risk is low  Upstream - 11/02/23 1015       Pregnancy Intention Screening   Does the patient want to become pregnant in the next year? N/A    Does the patient's partner want to become pregnant in the next year? N/A    Would the patient like to discuss contraceptive options today? N/A      Contraception Wrap Up   Current Method Female Sterilization   hyst   End Method Female Sterilization   hyst   Contraception Counseling Provided No         Examination chaperoned by Clarita Salt LPN  Assessment:     1. Abnormal urine odor (Primary) +odor for 1-2 weeks Drink water  Will rx macrobid   Meds ordered this encounter  Medications   nitrofurantoin , macrocrystal-monohydrate, (MACROBID ) 100 MG capsule    Sig: Take 1 capsule (100 mg total) by mouth 2 (two) times daily.    Dispense:  14 capsule    Refill:  0    Supervising Provider:   JAYNE MINDER H [[id number]]   UA C&S sent  - POCT Urinalysis Dipstick - Urine Culture - Urinalysis, Routine w reflex microscopic  2. Hematuria,  unspecified type Trace blood in urine   3. S/P hysterectomy      Plan:     Follow up prn

## 2023-11-03 LAB — URINALYSIS, ROUTINE W REFLEX MICROSCOPIC
Bilirubin, UA: NEGATIVE
Glucose, UA: NEGATIVE
Ketones, UA: NEGATIVE
Nitrite, UA: NEGATIVE
Protein,UA: NEGATIVE
RBC, UA: NEGATIVE
Specific Gravity, UA: 1.026 (ref 1.005–1.030)
Urobilinogen, Ur: 0.2 mg/dL (ref 0.2–1.0)
pH, UA: 5 (ref 5.0–7.5)

## 2023-11-03 LAB — SPECIMEN STATUS REPORT

## 2023-11-03 LAB — MICROSCOPIC EXAMINATION
Casts: NONE SEEN /LPF
RBC, Urine: NONE SEEN /HPF (ref 0–2)

## 2023-11-04 ENCOUNTER — Ambulatory Visit: Payer: Self-pay | Admitting: Adult Health

## 2023-11-04 LAB — URINE CULTURE

## 2023-11-04 LAB — SPECIMEN STATUS REPORT

## 2023-11-04 NOTE — Telephone Encounter (Signed)
 Left message @ 9:21 am, letting pt know the lab cancelled her urine and if she is still having symptoms, can come by and leave a urine and we could send it for a culture. JSY

## 2023-11-04 NOTE — Telephone Encounter (Signed)
 Spoke with pt. Pt states the smell is gone so she don't need to come for another sample. JSY

## 2023-11-04 NOTE — Telephone Encounter (Signed)
-----   Message from Liborio Negrin Torres sent at 11/04/2023  9:01 AM EDT ----- Let roderick know lab cancelled her urine, if still having symptoms can come leave a urine will send for culture. THX

## 2023-12-30 ENCOUNTER — Ambulatory Visit: Admitting: Adult Health

## 2024-02-05 ENCOUNTER — Telehealth: Payer: Self-pay | Admitting: Adult Health

## 2024-02-05 NOTE — Telephone Encounter (Signed)
 Patient is having same issues as last time. She wants to know if something can be sent in to the pharmacy. Please advise.

## 2024-02-05 NOTE — Telephone Encounter (Signed)
Left message @ 10:11 am. JSY 

## 2024-02-08 ENCOUNTER — Encounter: Payer: Self-pay | Admitting: Adult Health

## 2024-02-08 ENCOUNTER — Ambulatory Visit: Admitting: Adult Health

## 2024-02-08 VITALS — BP 119/71 | HR 85 | Ht 64.0 in | Wt 155.5 lb

## 2024-02-08 DIAGNOSIS — L700 Acne vulgaris: Secondary | ICD-10-CM

## 2024-02-08 DIAGNOSIS — L723 Sebaceous cyst: Secondary | ICD-10-CM

## 2024-02-08 DIAGNOSIS — R8290 Unspecified abnormal findings in urine: Secondary | ICD-10-CM | POA: Diagnosis not present

## 2024-02-08 LAB — POCT URINALYSIS DIPSTICK
Glucose, UA: NEGATIVE
Ketones, UA: NEGATIVE
Nitrite, UA: NEGATIVE
Protein, UA: NEGATIVE

## 2024-02-08 MED ORDER — NITROFURANTOIN MONOHYD MACRO 100 MG PO CAPS: 100.0000 mg | ORAL_CAPSULE | Freq: Two times a day (BID) | ORAL | 0 refills | Status: AC

## 2024-02-08 NOTE — Telephone Encounter (Signed)
 Pt has appt today to see JAG. Closing this encounter. JSY

## 2024-02-08 NOTE — Progress Notes (Signed)
  Subjective:     Patient ID: Karina Kirk, female   DOB: April 06, 1955, 68 y.o.   MRN: [id number]  HPI Karina Kirk is a 68 year old black female,single, sp hysterectomy, in complaining of odor in urine and bump on buttock and right underarm. She says the macrobid  made odor go away when took last time.   PCP is Dr Shona.   Review of Systems +odor in urine Bump on right buttock Bump right underarm Not having sex Reviewed past medical,surgical, social and family history. Reviewed medications and allergies.     Objective:   Physical Exam BP 119/71 (BP Location: Left Arm, Patient Position: Sitting, Cuff Size: Normal)   Pulse 85   Ht 5' 4 (1.626 m)   Wt 155 lb 8 oz (70.5 kg)   BMI 26.69 kg/m     Urine dipstick is trace blood and trace leuks Skin warm and dry. Lungs: clear to ausculation bilaterally. Cardiovascular: regular rate and rhythm., has sebaceous cyst right underarm, when squeezed cheesy material expressed, has comedone right buttock Fall risk is low  Upstream - 02/08/24 1626       Pregnancy Intention Screening   Does the patient want to become pregnant in the next year? N/A    Does the patient's partner want to become pregnant in the next year? N/A    Would the patient like to discuss contraceptive options today? N/A      Contraception Wrap Up   Current Method Hysterectomy    End Method Hysterectomy    Contraception Counseling Provided No          Assessment:     1. Abnormal urine odor (Primary) Urine sent for UA C&S  Will rx macrobid , and push water  Meds ordered this encounter  Medications   nitrofurantoin , macrocrystal-monohydrate, (MACROBID ) 100 MG capsule    Sig: Take 1 capsule (100 mg total) by mouth 2 (two) times daily.    Dispense:  14 capsule    Refill:  0    Supervising Provider:   JAYNE MINDER H [[id number]]    - POCT Urinalysis Dipstick - Urine Culture - Urinalysis, Routine w reflex microscopic  2. [place] Leave alone   3. Sebaceous cyst of  axilla Cheesy material expressed      Plan:     Follow up prn

## 2024-02-09 LAB — URINALYSIS, ROUTINE W REFLEX MICROSCOPIC
Bilirubin, UA: NEGATIVE
Glucose, UA: NEGATIVE
Ketones, UA: NEGATIVE
Nitrite, UA: NEGATIVE
Protein,UA: NEGATIVE
RBC, UA: NEGATIVE
Specific Gravity, UA: 1.021 (ref 1.005–1.030)
Urobilinogen, Ur: 0.2 mg/dL (ref 0.2–1.0)
pH, UA: 5 (ref 5.0–7.5)

## 2024-02-09 LAB — MICROSCOPIC EXAMINATION
Bacteria, UA: NONE SEEN
Casts: NONE SEEN /LPF
RBC, Urine: NONE SEEN /HPF (ref 0–2)

## 2024-02-11 ENCOUNTER — Ambulatory Visit: Payer: Self-pay | Admitting: Adult Health

## 2024-02-11 LAB — URINE CULTURE

## 2024-02-11 NOTE — Telephone Encounter (Signed)
-----   Message from Karina Kirk sent at 02/11/2024  1:41 PM EST ----- Let her know about urine THX

## 2024-02-11 NOTE — Telephone Encounter (Signed)
 Pt aware urine culture was + for e coli. Macrobid  should take care of it. Push fluids. Pt voiced understanding. JSY

## 2024-02-22 DIAGNOSIS — E782 Mixed hyperlipidemia: Secondary | ICD-10-CM | POA: Diagnosis not present

## 2024-02-26 DIAGNOSIS — Z0000 Encounter for general adult medical examination without abnormal findings: Secondary | ICD-10-CM | POA: Diagnosis not present

## 2024-02-26 DIAGNOSIS — K225 Diverticulum of esophagus, acquired: Secondary | ICD-10-CM | POA: Diagnosis not present

## 2024-02-26 DIAGNOSIS — E1169 Type 2 diabetes mellitus with other specified complication: Secondary | ICD-10-CM | POA: Diagnosis not present

## 2024-02-26 DIAGNOSIS — Z0001 Encounter for general adult medical examination with abnormal findings: Secondary | ICD-10-CM | POA: Diagnosis not present

## 2024-02-26 DIAGNOSIS — K7689 Other specified diseases of liver: Secondary | ICD-10-CM | POA: Diagnosis not present

## 2024-02-26 DIAGNOSIS — E782 Mixed hyperlipidemia: Secondary | ICD-10-CM | POA: Diagnosis not present

## 2024-02-26 DIAGNOSIS — H9209 Otalgia, unspecified ear: Secondary | ICD-10-CM | POA: Diagnosis not present

## 2024-02-26 DIAGNOSIS — G8929 Other chronic pain: Secondary | ICD-10-CM | POA: Diagnosis not present

## 2024-02-26 DIAGNOSIS — I10 Essential (primary) hypertension: Secondary | ICD-10-CM | POA: Diagnosis not present

## 2024-04-18 ENCOUNTER — Other Ambulatory Visit: Payer: Self-pay | Admitting: Adult Health
